# Patient Record
Sex: Male | Born: 1955 | Race: Black or African American | Hispanic: No | Smoking: Former smoker
Health system: Southern US, Community
[De-identification: ages and names within clinical notes are randomized; demographics above are authoritative.]

## PROBLEM LIST (undated history)

## (undated) DIAGNOSIS — C801 Malignant (primary) neoplasm, unspecified: Secondary | ICD-10-CM

## (undated) DIAGNOSIS — K219 Gastro-esophageal reflux disease without esophagitis: Secondary | ICD-10-CM

## (undated) DIAGNOSIS — N200 Calculus of kidney: Secondary | ICD-10-CM

## (undated) DIAGNOSIS — I10 Essential (primary) hypertension: Secondary | ICD-10-CM

## (undated) DIAGNOSIS — R011 Cardiac murmur, unspecified: Secondary | ICD-10-CM

## (undated) HISTORY — PX: CYSTOSCOPY: SHX5120

---

## 2016-06-04 ENCOUNTER — Other Ambulatory Visit: Payer: Self-pay | Admitting: General Surgery

## 2016-06-04 DIAGNOSIS — C187 Malignant neoplasm of sigmoid colon: Secondary | ICD-10-CM

## 2016-06-07 ENCOUNTER — Telehealth: Payer: Self-pay | Admitting: General Surgery

## 2016-06-07 ENCOUNTER — Ambulatory Visit
Admission: RE | Admit: 2016-06-07 | Discharge: 2016-06-07 | Disposition: A | Discharge status: Home / Self Care | Payer: BLUE CROSS/BLUE SHIELD | Source: Ambulatory Visit | Attending: General Surgery | Admitting: General Surgery

## 2016-06-07 ENCOUNTER — Other Ambulatory Visit: Payer: Self-pay | Admitting: General Surgery

## 2016-06-07 DIAGNOSIS — C187 Malignant neoplasm of sigmoid colon: Secondary | ICD-10-CM

## 2016-06-07 MED ORDER — IOPAMIDOL (ISOVUE-300) INJECTION 61%
100.0000 mL | Freq: Once | INTRAVENOUS | Status: AC | PRN
  Administered 2016-06-07: 100 mL via INTRAVENOUS

## 2016-06-07 NOTE — Telephone Encounter (Signed)
Left message on machine that it was me calling about CT scan and to call our ([facility]) office in AM.

## 2016-06-10 ENCOUNTER — Ambulatory Visit (HOSPITAL_BASED_OUTPATIENT_CLINIC_OR_DEPARTMENT_OTHER): Payer: BLUE CROSS/BLUE SHIELD | Admitting: Oncology

## 2016-06-10 ENCOUNTER — Telehealth: Payer: Self-pay | Admitting: Oncology

## 2016-06-10 ENCOUNTER — Encounter: Payer: Self-pay | Admitting: *Deleted

## 2016-06-10 ENCOUNTER — Other Ambulatory Visit: Payer: Self-pay | Admitting: *Deleted

## 2016-06-10 VITALS — BP 144/84 | HR 104 | Temp 99.5°F | Resp 20 | Ht 72.0 in | Wt 141.8 lb

## 2016-06-10 DIAGNOSIS — D649 Anemia, unspecified: Secondary | ICD-10-CM

## 2016-06-10 DIAGNOSIS — C787 Secondary malignant neoplasm of liver and intrahepatic bile duct: Secondary | ICD-10-CM | POA: Diagnosis not present

## 2016-06-10 DIAGNOSIS — G893 Neoplasm related pain (acute) (chronic): Secondary | ICD-10-CM | POA: Diagnosis not present

## 2016-06-10 DIAGNOSIS — C187 Malignant neoplasm of sigmoid colon: Secondary | ICD-10-CM | POA: Diagnosis not present

## 2016-06-10 MED ORDER — AMOXICILLIN-POT CLAVULANATE 500-125 MG PO TABS: 500.0000 mg | ORAL_TABLET | Freq: Two times a day (BID) | ORAL | 0 refills | Status: DC

## 2016-06-10 MED ORDER — OXYCODONE-ACETAMINOPHEN 5-325 MG PO TABS: 1.0000 | ORAL_TABLET | ORAL | 0 refills | Status: DC | PRN

## 2016-06-10 NOTE — Patient Instructions (Signed)
Care Plan Summary- 06/10/2016 Name:  Andre Fernandez      DOB: 1956-10-05 Your Medical Team:  Medical Oncologist:  Dr. Ma Rings Radiation Oncologist:    Surgeon:   Dr. 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   Recommendations: Based on information available as of today's consult. Recommendations may change depending on the results of further tests or exams. 1) Chemotherapy every 2 weeks with or without surgery---TBD 2) Gastrographin enema in radiology to determine if there is a leak in colon 3) Keep bowels moving and keep them soft with Miralax and Senna-S (OTC) ___________________________________________________________________________ Next Steps: 1) Radiology will call with date/time for enema study 2) See [name] as scheduled on 06/15/16 at 2 pm 3) Call for shaking chills or high fevers that do not go away Questions? [name], RN, BSN at [phone]. [name] is your Oncology Nurse Navigator and is available to assist you while you're receiving your medical care at [facility]. If found to have a leak in colon, [name] will recommend surgery to remove the cancer and may/may not require colostomy-then chemo will follow after healing If found to not have a leak, can have PAC placed on 06/17/16 and start chemo the week of 06/21/16 and you will return to see [name] on 06/18/16 at 10:00. [name] will still discuss surgical options based on potential for perforation in future with chemotherapy Stage IV cancer is not curable, but is treatable with median life expectancy of 3 years (some live less and some more) Without any treatment, the cancer could end his life in a few months We suggest you contact your employer about if they have long term disability  benefits-you will not be able to work for several months Pain: Percocet 5/325 tablets-take one every 4 hours as needed for pain (call if not controlled) Bowels: Start Miralax 17 grams in 8 ounces fluid daily as well as adding Senna-S one tablet twice daily (can increase to #2 twice daily if needed or back down if stools too loose). Best to keep your bowels soft and moving. We will send your colonoscopy tissue to [facility] for molecular testing to see if your tumor will respond to other therapies in the future, such as immunotherapy

## 2016-06-10 NOTE — Telephone Encounter (Signed)
Gave pt cal & avs, apt time per pof

## 2016-06-10 NOTE — Progress Notes (Signed)
Oncology Nurse Navigator Documentation  Oncology Nurse Navigator Flowsheets 06/10/2016  Navigator Location [facility]-Med Onc  Navigator Encounter Type Initial MedOnc  Abnormal Finding Date 06/03/2016  Confirmed Diagnosis Date 06/03/2016  Patient Visit Type MedOnc;Initial  Treatment Phase Pre-Tx/Tx Discussion;Abnormal Scans  Barriers/Navigation Needs Financial;Family concerns;Coordination of Film/video editor Finding Providers;Understanding Cancer/ Treatment Options;Coping with Diagnosis/ Prognosis;Pain/ Symptom Management;Newly Diagnosed Cancer Education;Preparing for Upcoming Surgery/ Treatment;Concerns with Insurance Coverage  Interventions Referrals;Coordination of Care;Education Method  Coordination of Care Appts--called [facility] and made appointment for 06/16/16 at 2 pm (patient notified)  Education Method Verbal;Written;Teach-back  Support Groups/Services GI Support Group;Tome;Other--[facility] support services  Acuity Level 3  Time Spent with Patient 46  Met with patient, daughter, [name] and exwife Langley Gauss during new patient visit. Explained the role of the GI Nurse Navigator and provided New Patient Packet with information on: 1.  Colon cancer-gave info on and discussed PAC, CEA test, [facility] Testing  2. Support groups 3. Advanced Directives 4. Fall Safety Plan Answered questions, reviewed current treatment plan using TEACH back and provided emotional support. Provided copy of current treatment plan to patient, daughter and ex-wife. Reviewed information with family extensively-his ex-wife is very detail oriented and asked questions frequently, often the same question. At conclusion of visit, made patient aware that we will continue to work on getting his treatment started as soon as possible and that at any time if he wishes to obtain a 2nd opinion we will be glad to assist in this process. Encouraged him to speak with his HR  representative at work to get his disability started asap if they have this benefit, if not he needs to start filing for SSD. Had him complete the patient assistance form for [facility] as well as sign consent to send tissue off. Will fax the application to [facility]. Stressed that he needs to keep his bowels soft and moving. He will call for any high fevers or shaking chills. Encouraged him to try Boost/Ensure for extra protein and calories in addition to eating high fiber diet.  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 He and his ex wife have been divorced many years, but are still friends. He will be staying with his daughter while having treatment. Currently has insurance, but encouraged him to talk with HR about how to continue this since he is not working. Patient and especially his ex are very resistant to him having a colostomy. There seems to be some distrust of medical field coming from his ex-wife. He will return on 8/4 to be seen unless [name] feels he needs surgery before chemotherapy.  [name], RN, BSN GI Oncology [place]

## 2016-06-10 NOTE — Progress Notes (Signed)
Andre Fernandez New Patient Consult   Referring MD: Stark Klein, Md [address] [place], [place] [address]   Andre Fernandez 60 y.o.  02-12-1956    Reason for Referral: Colon cancer   HPI: He reports abdominal pain and constipation beginning in March of this year. He saw his primary physician and was diagnosed with "irritable bowel syndrome ". He was initially evaluated in [place]. When he returned to [facility] he was seen at [facility] and was referred to Dr. Michail Sermon. He was taken to an upper and lower endoscopy on 06/03/2016. The upper endoscopy was unremarkable. The colonoscopy revealed a partially obstructing mass in the rectosigmoid colon. The mass was circumferential and 10 cm in length. The mass extended from 5-15 cm from the anus. Biopsies were obtained. A large amount of stool and sigmoid colon prevented visualization and further passage of the colonoscope. The pathology [id number]) revealed invasive poorly differentiated carcinoma. The tumor cells are positive for cytokeratin 20, CD X-2, and cytokeratin 5/6 consistent with poorly differentiated colorectal adenocarcinoma.   He was referred to [name]. CTs of the abdomen and pelvis on 06/07/2016 revealed coronary artery calcifications. Multiple borderline enlarged and enlarged mediastinal and bilateral hilar nodes measured up to 16 mm. Many of these nodes demonstrated coarse calcifications. Multiple pulmonary nodules suspicious for metastatic disease. Numerous hypodense lesions in the liver consistent with metastatic disease. One lesion extends medial to the liver where it appeared to partially encase the duodenum. Irregular mass in the mid sigmoid colon with surrounding metastatic deposits in the mesocolon. Small volume of ascites in the low anatomic pelvis. Numerous mesorectal lymph nodes.   Past medical history: 1.  Hypertension 2.  Glaucoma  Past surgical history: None   Medications:  Reviewed  Allergies: No Known Allergies  Family history: A sister had multiple myeloma. He had 10 siblings and he has 2 children. No family history of cancer  Social History:  He is a Administrator. He quit smoking cigarettes and using alcohol 3 years ago. No risk factor for HIV or hepatitis.   ROS:   Positives include: 30 pound weight loss, dark stool, pain with urination, constant hiccups, fever up to 101 every other day, low abdominal pain  A complete ROS was otherwise negative.  Physical Exam:  Blood pressure (!) 144/84, pulse (!) 104, temperature 99.5 F (37.5 C), temperature source Oral, resp. rate 20, height 6' (1.829 m), weight 141 lb 12.8 oz (64.3 kg), SpO2 98 %.  HEENT: Oropharynx without visible mass, neck without mass Lungs: Clear bilaterally Cardiac: Regular rate and rhythm Abdomen: No hepatosplenomegaly, mild tenderness in the right lateral subcostal region, no mass, no apparent ascites GU: Testes without mass  Vascular: No leg edema Lymph nodes: No cervical, supraclavicular, axillary, or inguinal nodes Neurologic: Alert and oriented, the motor exam appears intact in the upper and lower extremities Skin: No rash Musculoskeletal: No spine tenderness   LAB: 06/04/2016: CEA 5.2, hematoma 10.9, MCV 79.5, platelets 427,000, white count 15.2, ANC 12.9 BUN 11, creatinine 0.81, alkaline phosphatase 202, AST 56, ALT 23, bilirubin 0.8   Imaging:  As per history of present illness-CT images from 06/07/2016 reviewed with the patient and his family   Assessment/Plan:   1. Rectal cancer-biopsy of a rectal mass on 06/03/2016 confirmed invasive poorly differential carcinoma consistent with colorectal cancer  CTs of the chest, abdomen, and pelvis on 06/07/2016 revealed mediastinal lymphadenopathy, lung nodules, liver metastases, a mass that appeared to be centered at the sigmoid  colon with surrounding lymphadenopathy and peritoneal implants  2.   Microcytic anemia  3.    Pain secondary to #1  4.   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 I reviewed the CT images and discussed the prognosis and treatment options with Andre Fernandez and his family. No therapy will be curative.  His case was presented at the GI tumor conference on 06/09/2016. I discussed the case with [name] today. We are concerned he may have a perforated tumor and pelvic infection accounting for his fever. [name] recommends proceeding with a Gastrografin enema to look for evidence of a bowel perforation.  If there is no evidence of a perforation the plan will be to proceed with systemic therapy. If there is a perforation [name] will consider an attempt at resecting the primary tumor.  [name] will place a Port-A-Cath for the administration of systemic therapy. We submitted the rectal biopsy material for [facility] testing.  Andre Fernandez will use oxycodone/APAP as needed for pain. He will begin MiraLAX and Senokot. He will begin Augmentin. He will return for an office visit and further discussion on 06/18/2016.  Approximately 50 minutes were spent with the patient today. The majority of time was used for counseling and coordination of care.  Betsy Coder, MD  06/10/2016, 5:20 PM

## 2016-06-11 ENCOUNTER — Other Ambulatory Visit: Payer: Self-pay | Admitting: General Surgery

## 2016-06-15 NOTE — Progress Notes (Addendum)
GI Location of Tumor / Histology: Metastatic  Colon Cancer  Andre Fernandez presented  months ago with symptoms of: Abdominal cramping since  March 2017  Biopsies of  (if applicable) revealed: 0000000 Upper and lower Endoscopy  bxs obtained rectosigmoid colon,partial obstructing mass,The pathology [id number]) revealed invasive poorly differentiated carcinoma. The tumor cells are positive for cytokeratin 20, CD X-2, and cytokeratin 5/6 consistent with poorly differentiated colorectal adenocarcinoma.from [name] note, need path report    Past/Anticipated interventions by surgeon, if any: [name], possible Laparoscopic diverted colostomy 06/18/16  Past/Anticipated interventions by medical oncology, Dr. Glori Bickers appt 06/18/16  weight changes, if any: 30 lb weight loss,  Bowel/Bladder complaints, if any:   Nausea / Vomiting, if any:   Pain issues, if any:  Low abdominal pain  Any blood per rectum:     SAFETY ISSUES:  Prior radiation? No  Pacemaker/ICD? NO  Is the patient on methotrexate? NO  Current Complaints/Details: CT 06/07/16 :IMPRESSION: 1. 6.8 x 8.3 x 7.5 cm infiltrative colonic mass centered in the mid sigmoid colon with direct extension into the adjacent soft tissues, widespread intraperitoneal metastatic disease in the low anatomic pelvis, small volume of malignant ascites, pelvic, mesorectal and upper abdominal lymphadenopathy, widespread metastatic disease to the liver, and numerous pulmonary metastases, as above. Notably, one of the liver lesions centered in segments 5 and 6 appears to be invading of medial to the liver capsule, partially encasing the first and second portions of the duodenum. This does not appear to be causing obstruction at this time. 2. Trace bilateral pleural effusions may be malignant. 3. Extensive mediastinal and bilateral hilar lymphadenopathy is also noted. Although this could be metastatic, many these lymph nodes are coarsely  calcified, suggesting benign disease such as old granulomatous disease or potential sarcoidosis. 4. Nonobstructive calculi within the collecting systems of the kidneys bilaterally measuring up to 3 mm in the upper pole of the left kidney. 5. Additional incidental findings, as above. These results will be called to the ordering clinician or representative by the Radiologist Assistant, and communication documented in the PACS or zVision Dashboard   Allergies:

## 2016-06-16 ENCOUNTER — Encounter (HOSPITAL_COMMUNITY): Payer: Self-pay

## 2016-06-16 ENCOUNTER — Encounter (HOSPITAL_COMMUNITY)
Admission: RE | Admit: 2016-06-16 | Discharge: 2016-06-16 | Disposition: A | Discharge status: Home / Self Care | Payer: BLUE CROSS/BLUE SHIELD | Source: Ambulatory Visit | Attending: General Surgery | Admitting: General Surgery

## 2016-06-16 ENCOUNTER — Other Ambulatory Visit: Payer: Self-pay

## 2016-06-16 ENCOUNTER — Ambulatory Visit (HOSPITAL_COMMUNITY)
Admission: RE | Admit: 2016-06-16 | Discharge: 2016-06-16 | Disposition: A | Discharge status: Home / Self Care | Payer: BLUE CROSS/BLUE SHIELD | Source: Ambulatory Visit | Attending: Anesthesiology | Admitting: Anesthesiology

## 2016-06-16 ENCOUNTER — Telehealth: Payer: Self-pay | Admitting: *Deleted

## 2016-06-16 DIAGNOSIS — I10 Essential (primary) hypertension: Secondary | ICD-10-CM | POA: Insufficient documentation

## 2016-06-16 DIAGNOSIS — F22 Delusional disorders: Secondary | ICD-10-CM

## 2016-06-16 DIAGNOSIS — Z01818 Encounter for other preprocedural examination: Secondary | ICD-10-CM | POA: Diagnosis present

## 2016-06-16 DIAGNOSIS — J90 Pleural effusion, not elsewhere classified: Secondary | ICD-10-CM | POA: Insufficient documentation

## 2016-06-16 DIAGNOSIS — Z87891 Personal history of nicotine dependence: Secondary | ICD-10-CM | POA: Insufficient documentation

## 2016-06-16 HISTORY — DX: Malignant (primary) neoplasm, unspecified: C80.1

## 2016-06-16 HISTORY — DX: Calculus of kidney: N20.0

## 2016-06-16 HISTORY — DX: Gastro-esophageal reflux disease without esophagitis: K21.9

## 2016-06-16 HISTORY — DX: Cardiac murmur, unspecified: R01.1

## 2016-06-16 LAB — TYPE AND SCREEN
ABO/RH(D): O POS
ANTIBODY SCREEN: NEGATIVE

## 2016-06-16 LAB — COMPREHENSIVE METABOLIC PANEL
ALT: 31 U/L (ref 17–63)
AST: 43 U/L — ABNORMAL HIGH (ref 15–41)
Albumin: 2.3 g/dL — ABNORMAL LOW (ref 3.5–5.0)
Alkaline Phosphatase: 386 U/L — ABNORMAL HIGH (ref 38–126)
Anion gap: 10 (ref 5–15)
BUN: 7 mg/dL (ref 6–20)
CALCIUM: 9 mg/dL (ref 8.9–10.3)
CHLORIDE: 98 mmol/L — AB (ref 101–111)
CO2: 28 mmol/L (ref 22–32)
CREATININE: 0.87 mg/dL (ref 0.61–1.24)
Glucose, Bld: 121 mg/dL — ABNORMAL HIGH (ref 65–99)
Potassium: 4.1 mmol/L (ref 3.5–5.1)
Sodium: 136 mmol/L (ref 135–145)
Total Bilirubin: 0.6 mg/dL (ref 0.3–1.2)
Total Protein: 5.9 g/dL — ABNORMAL LOW (ref 6.5–8.1)

## 2016-06-16 LAB — CBC
HCT: 33.1 % — ABNORMAL LOW (ref 39.0–52.0)
Hemoglobin: 10.9 g/dL — ABNORMAL LOW (ref 13.0–17.0)
MCH: 25.9 pg — ABNORMAL LOW (ref 26.0–34.0)
MCHC: 32.9 g/dL (ref 30.0–36.0)
MCV: 78.6 fL (ref 78.0–100.0)
PLATELETS: 470 10*3/uL — AB (ref 150–400)
RBC: 4.21 MIL/uL — AB (ref 4.22–5.81)
RDW: 16 % — ABNORMAL HIGH (ref 11.5–15.5)
WBC: 16 10*3/uL — AB (ref 4.0–10.5)

## 2016-06-16 LAB — ABO/RH: ABO/RH(D): O POS

## 2016-06-16 NOTE — Pre-Procedure Instructions (Signed)
Andre Fernandez  06/16/2016      [facility], [place] Andre Fernandez [place] [address] Phone: [phone] Fax: [phone]    Your procedure is scheduled on 06/18/2016  Report to [facility] Admitting at 5:30 A.M.  Call this number if you have problems the morning of surgery:  [phone]   Remember:  Do not eat food or drink liquids after midnight.  Take these medicines the morning of surgery with A SIP OF WATER:    Bisoprolol  &( pain medicine OK if needed) , follow instructions with bowel prep,  [name], powders, [name], [name], discharge time will be determined by your treatment team.  Patients discharged the day of surgery will not be allowed to drive home.   Name and phone number of your driver:   With family Special instructions:  Special Instructions: Andre Fernandez - Preparing for Surgery  Before surgery, [name], [name], [name], [name], ears, mouth and genitals (private parts).  Wash genitals (private parts)   with your normal soap.  6.  Wash thoroughly, paying special attention to the area where your surgery will be performed.  7.  Thoroughly rinse your body with warm water from the neck down.  8.  DO NOT shower/wash with your normal soap after using and rinsing off   the CHG Soap.  9.  Pat yourself dry with a clean towel.  10.  Wear clean pajamas.            11.  Place clean sheets on your bed the night of your first shower and do not sleep with pets.  Day of Surgery  Do not apply any lotions/deodorants the morning of surgery.  Please wear clean clothes to the hospital/surgery center.  Please read over the following fact sheets that you were given. Pain Booklet and Coughing and Deep Breathing

## 2016-06-16 NOTE — Progress Notes (Signed)
Conversation with AUlice Brilliant PA-c again to review EKG result. She will meet with pt.

## 2016-06-16 NOTE — Progress Notes (Signed)
Call to A. Zelenak,PA-C, reported to her that Mr. Hesley was found to have a rapid heartrate in PAT VS check. Pt. Denies shortness breathing, any changes in his chest, reports that when he was seen in MD office yesterday it was also elevated to 120. Pt. Pulse on VS monitor is ranging from 132-142bpm. Pt. Denies afib or dysrythmia history. Will complete EKG as requested by PA.

## 2016-06-16 NOTE — Progress Notes (Signed)
Call to [facility] [place], reported that [name] has not released her OR consent order & further (pt.) needs clarification on bowel prep (or not) to include premed at home, relative to Flagyl & Neomycin.  Followup call to Abigail Butts to mention that Anesth. PA was reviewing pt. Health history & how he is feeling due to tachycardia .

## 2016-06-16 NOTE — Progress Notes (Signed)
Anesthesia Note: Patient is a 60 year old male scheduled for laparoscopic diverted colostomy, insertion of a Port-a-cath on 06/18/16 by Dr. Barry Fernandez.  History includes stage IV colon cancer with partially obstructing sigmoid mass (diagnosed 06/03/16), HTN, glaucoma, microcytic anemia, former smoker (quit 11/15/13). Quit ETOH ~ 3 years ago. In the months prior to his diagnosis of colon cancer, his doctor in [facility] was treating him for what was thought to be GERD and IBS.   He lives in [place], [place]. His daughter used to work at [employer] as a transporter. Patient currently living with daughter Andre Fernandez and her brother who has autism. Currently no PCP in [place], but was seeing Dr. Rudi Fernandez with [facility] in [place], [place]. He has been seen once at [facility] (walk-in clinic) in [facility] in 02/2016 for reflux, IBS, and weight loss (25-30 lb since 12/2015). He was referred to GI Dr. Michail Fernandez. Patient ultimately had a colonoscopy with biopsies revealing invasive poorly differentiated carcinoma. He was then referred to [facility] Dr. Benay Fernandez and to [facility] Dr. Barry Fernandez. Surgery recommended as above, sooner if he develops bloating and vomiting. Reportedly he was just seen by Dr. Barry Fernandez yesterday to discuss surgery plans.   Meds include Augmentin (started by Dr. Benay Fernandez on 06/10/16; having abdominal pains with intermittent fevers up to 101), bisoprolol-HCTZ 10-6.25 mg (Ziac), Percocet, Senna, Lumigan. Had Augmentin, Percocet, Tylenol (8:30 AM of T ~ 100 F). Last took [place] yesterday evening. He is to start Flagyl and neomycin the day before surgery.   PAT Vitals: BP 118/76, HR 132, RR 18, T 36.8C, O2 sat 99%. HT 6'. WT 141 lb (64.2 kg). BMI 19.20.  Exam shows a pleasant black male in NAD. He is tall and thin. Sitting in a wheelchair mostly, but can walk to bathroom unassisted. Heart regular rhythm, but tachycardic in the 120's. No murmur heard. Lung sounds diminished, particularly in the bases. No  carotid bruits noted. No pretibial edema noted. Good mouth opening. BS are hypoactive. Abdomen soft. Non-tender to soft palpation.  He denied CP, SOB, syncope, presyncope, nausea. Denied dizziness with standing. Pain in the abdomen when walking (started in March). This is not worse. Last BM 06/16/16. He has occasional frothy emesis when he develops hiccups, last episode 06/12/16. He does feel his voice quality is weaker since March 2017. He denied personal history of CAD/MI/CHF/PCI. He does have a family history of CAD in his parents when they were in their 68's and 81's. He sleeps on his left side for comfort.   06/16/16 EKG: ST at 127 bpm. (HR was ~ 120-128 bpm during the PAT RN interview.)  06/07/16 CT chest/abd/pelvis: IMPRESSION: 1. 6.8 x 8.3 x 7.5 cm infiltrative colonic mass centered in the mid sigmoid colon with direct extension into the adjacent soft tissues, widespread intraperitoneal metastatic disease in the low anatomic pelvis, small volume of malignant ascites, pelvic, mesorectal and upper abdominal lymphadenopathy, widespread metastatic disease to the liver, and numerous pulmonary metastases, as above. Notably, one of the liver lesions centered in segments 5 and 6 appears to be invading of medial to the liver capsule, partially encasing the first and second portions of the duodenum. This does not appear to be causing obstruction at this time. 2. Trace bilateral pleural effusions may be malignant. 3. Cardiovascular: Heart size is normal. Small amount of anterior pericardial fluid and/or thickening, unlikely to be of any hemodynamic significance at this time. No associated pericardial calcification. There is aortic atherosclerosis, as well as atherosclerosis  of the great vessels of the mediastinum and the coronary arteries, including calcified atherosclerotic plaque in the left anterior descending coronary arteries. 4. Extensive mediastinal and bilateral hilar lymphadenopathy is  also noted. Although this could be metastatic, many these lymph nodes are coarsely calcified, suggesting benign disease such as old granulomatous disease or potential sarcoidosis. 5. Nonobstructive calculi within the collecting systems of the kidneys bilaterally measuring up to 3 mm in the upper pole of the left kidney.  06/16/16 CXR: IMPRESSION: 1. Small bilateral pleural effusions have increased since the prior study. 2. The lungs are otherwise clear.  Preoperative labs noted. WBC 16.0 (previously 15.2K on 06/04/16), H/H 10.9/33.1 (previously 10.9/34.1 on 06/04/16). Cr 0.87. Alk Phos 386. Albumin 2.3. AST 43, ALT 31, total protein 5.9.   Patient has baseline tachycardia, primarily in the 120's today at PAT. He does not appear in any acute distress. He was tachycardic at 104 bpm at his 06/10/16 visit and reportedly 119 bpm at his 06/15/16 with Dr. Barry Fernandez. He is not having chest pain, dizziness, or SOB. EKG does not show any concerning ST/T wave changes. I think tachycardia is multifactorial including deconditioning, malnutrition, leukocytosis with intermittent low grade fever, pain. He does not feel any different from when he saw Dr. Barry Fernandez yesterday, and reportedly had discussed with her the intermittent fevers, abdominal pain (present since 01/2016), and rare emesis. He is now on pain meds and antibiotic therapy. He said he was instructed to go to the ED if his temperature was > 101 despite Tylenol or if he develops bloating or persistent N/V. We reviewed how he could monitor his radial pulse at home. I also advised that if his HR was > 120's range to seek further evaluation in the ED, or if in general not feeling well, eating poorly, or develops SOB, CP. He will continue Ziac which includes a b-blocker which will hopefully help with his HR control. Discussed with anesthesiologist Dr. Orene Fernandez as well. Agree with above plan. Patient will be further evaluated by his surgeon and anesthesiologist on the day of  surgery. If patient is still tachycardiac then it can be addressed on the day of surgery with IVF, medications as needed. I have sent a staff message to Dr. Barry Fernandez as well giving an update so she can contact him for any additional recommendations.  George Hugh [facility] Short Stay Center/Anesthesiology Phone [phone] 06/16/2016 6:09 PM

## 2016-06-16 NOTE — H&P (Signed)
Andre Fernandez 06/15/2016 9:45 AM Location: [facility] Patient #: [id number] DOB: September 24, 1956 Single / Language: Cleophus Molt / Race: Black or African American Male   History of Present Illness Stark Klein MD; 06/15/2016 5:35 PM) Patient words: Discuss before surgery.  The patient is a 60 year old male who presents with colorectal cancer. Patient is a 60 year old male referred for consultation by Dr. Michail Sermon for a new diagnosis of sigmoid colon cancer in late July 2017. The patient was self-referred to Dr. Michail Sermon with new diarrhea and weight loss. He also is having reflux. He states that he has a good appetite, but this is not able to eat as much as he previously had been. 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 He no longer drinks or smokes and hasn't for a year. He has no family history of cancer. He has not ever had a colonoscopy prior to this one.  Dr. Michail Sermon performed a colonoscopy on him on 06/03/2016. He was seen to have a malignant partially obstructing tumor in the rectosigmoid colon. This was biopsied. Biopsy is not yet back but this was obviously fungating and was around two thirds of the circumference of the colon. I ordered scans on him and referral to oncology. He was found to have a much larger tumor than expected, multple liver mets, and probable contained perforation of mass . We reviewed him at conference and recommendation was to divert him and then do chemo in order to minimize risk of freely perforated tumor with peritonitis while getting chemo. He is here to discuss.   When he saw [name], he was having fevers of 103 at home. He placed him on Augmentin. We discussed doing a gastrograffin enema, but it was felt to increase risk of perforation, rather than give [place] information. He is still having fevers, but they are only to 100-101.   He is having some lower abdominal pain and taking 1 percocet in the morning  and at bedtime. He is taking 1-2 motrin during the day. He reports that his weight is stable. He is taking stool softeners and eating fiber. He is stooling soft stools.   CT abd/pelvis 06/07/16 IMPRESSION: 1. 6.8 x 8.3 x 7.5 cm infiltrative colonic mass centered in the mid sigmoid colon with direct extension into the adjacent soft tissues, widespread intraperitoneal metastatic disease in the low anatomic pelvis, small volume of malignant ascites, pelvic, mesorectal and upper abdominal lymphadenopathy, widespread metastatic disease to the liver, and numerous pulmonary metastases, as above. Notably, one of the liver lesions centered in segments 5 and 6 appears to be invading of medial to the liver capsule, partially encasing the first and second portions of the duodenum. This does not appear to be causing obstruction at this time. 2. Trace bilateral pleural effusions may be malignant. 3. Extensive mediastinal and bilateral hilar lymphadenopathy is also noted. Although this could be metastatic, many these lymph nodes are coarsely calcified, suggesting benign disease such as old granulomatous disease or potential sarcoidosis. 4. Nonobstructive calculi within the collecting systems of the kidneys bilaterally measuring up to 3 mm in the upper pole of the left kidney. 5. Additional incidental findings, as above. These results will be called to the ordering clinician or representative by the Radiologist Assistant, and communication documented in the PACS or zVision Dashboard.    Allergies Patsey Berthold, [place]; 06/15/2016 9:45 AM) No Known Drug Allergies07/21/2017  Medication History Patsey Berthold, CMA; 06/15/2016 9:46 AM)  Neomycin Sulfate (500MG  Tablet, 2 (two) Tablet Oral SEE NOTE, Taken starting 06/04/2016) Active. (TAKE TWO TABLETS AT 2 PM, 3 PM, AND 10 PM THE DAY PRIOR TO SURGERY) Flagyl (500MG  Tablet, 2 (two) Tablet Oral SEE NOTE, Taken starting 06/04/2016) Active. (Take at 2pm,  3pm, and 10pm the day prior to your colon operation) Bisoprolol-Hydrochlorothiazide (10-6.25MG  Tablet, Oral) Active. Senna S (8.6-50MG  Tablet, Oral) Active. MiraLax (Oral) Active. Percocet (5-325MG  Tablet, Oral) Active. Amoxicillin-Pot Clavulanate (500-125MG  Tablet, Oral) Active. Medications Reconciled    Review of Systems Stark Klein MD; 06/15/2016 5:34 PM) All other systems negative  Vitals Patsey Berthold CMA; 06/15/2016 9:47 AM) 06/15/2016 9:46 AM Weight: 144.6 lb Height: 72in Body Surface Area: 1.86 m Body Mass Index: 19.61 kg/m  Temp.: 98.7F  Pulse: 119 (Regular)  BP: 120/72 (Sitting, Left Arm, Standard)       Physical Exam Stark Klein MD; 06/15/2016 5:35 PM) General Mental Status-Alert. General Appearance-Consistent with stated age. Hydration-Well hydrated. Voice-Normal.  Eye Note: anicteric   Chest and Lung Exam Chest and lung exam reveals -quiet, even and easy respiratory effort with no use of accessory muscles. Inspection Chest Wall - Normal. Back - normal.  Abdomen Inspection-Inspection Normal. Palpation/Percussion Palpation and Percussion of the abdomen reveal - Soft, Non Tender, No Rebound tenderness, No Rigidity (guarding) and No hepatosplenomegaly.    Assessment & Plan Stark Klein MD; 06/15/2016 5:42 PM) CANCER OF SIGMOID COLON (C18.7) Impression: Due to the markedly different findings on the scan than anticipated, we will plan lap diverting ostomy and port a cath.  We reviewed that he looks partially obstructed and appears to have had a contained perforation. This is why we would plan diverting ostomy, in order to minimize his risk of frank peritonitis and emergency surgery when he is on chemo and more immunocompromised.  If he appears to have any sort of leak near the tumor, I would leave a drain.  I would go ahead and place port in order to get him to chemo as soon as possible.  I discussed that he would have a  risk of having too extensive carcinomatosis to do an ostomy and just get a port. I also reviewed that the ostomy may end up being permanent depending on what happens to the rest of his disease.  I also discussed risk of pneumothorax, risk of clot/malfunction with port, risk of hernia etc. Pt wishes to proceed ASAP. I have him on the schedule for this friday.  He will continue augmentin. 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  Would plan treatment with chemotherapy. 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 CONSTIPATION BY OUTLET OBSTRUCTION (K59.02) Impression: Continue stool softeners.    Signed by Stark Klein, MD (06/15/2016 5:43 PM)

## 2016-06-16 NOTE — Telephone Encounter (Signed)
Oncology Nurse Navigator Documentation  Oncology Nurse Navigator Flowsheets 06/16/2016  Navigator Location [facility]  Navigator Encounter Type Telephone  Telephone Outgoing Call;Appt Confirmation/Clarification--called daughter, Andre Fernandez (his phone was not working) and provided new appointment to see [name] on 8/16 at 11:00. She was informed this is not to start his chemo, just to discuss the pathology and the treatment planned and drugs he will be given. He will then need to attend a chemo class. [name] got on the phone wanting chemo to start that day-informed her he has to heal before he can safely start chemo.  Abnormal Finding Date -  Confirmed Diagnosis Date -  Patient Visit Type -  Treatment Phase -  Barriers/Navigation Needs -  Education -  Interventions -  Coordination of Care -  Education Method -  Support Groups/Services -  Acuity -  Time Spent with Patient 15

## 2016-06-17 ENCOUNTER — Ambulatory Visit
Admission: RE | Admit: 2016-06-17 | Discharge: 2016-06-17 | Disposition: A | Discharge status: Home / Self Care | Payer: BLUE CROSS/BLUE SHIELD | Source: Ambulatory Visit | Attending: Radiation Oncology | Admitting: Radiation Oncology

## 2016-06-17 ENCOUNTER — Ambulatory Visit: Payer: BLUE CROSS/BLUE SHIELD | Admitting: Radiation Oncology

## 2016-06-17 NOTE — Anesthesia Preprocedure Evaluation (Addendum)
Anesthesia Evaluation  Patient identified by MRN, date of birth, ID band Patient awake    Reviewed: Allergy & Precautions, H&P , NPO status , Patient's Chart, lab work & pertinent test results, reviewed documented beta blocker date and time   Airway Mallampati: II  TM Distance: >3 FB Neck ROM: Full    Dental no notable dental hx. (+) Teeth Intact, Dental Advisory Given   Pulmonary neg pulmonary ROS, former smoker,    Pulmonary exam normal breath sounds clear to auscultation       Cardiovascular + Valvular Problems/Murmurs  Rhythm:Regular Rate:Normal     Neuro/Psych negative neurological ROS  negative psych ROS   GI/Hepatic Neg liver ROS, GERD  Controlled,Colon CA w/ liver mets   Endo/Other  negative endocrine ROS  Renal/GU negative Renal ROS  negative genitourinary   Musculoskeletal   Abdominal   Peds  Hematology negative hematology ROS (+)   Anesthesia Other Findings   Reproductive/Obstetrics negative OB ROS                            Anesthesia Physical Anesthesia Plan  ASA: III  Anesthesia Plan: General   Post-op Pain Management:    Induction: Intravenous  Airway Management Planned: Oral ETT  Additional Equipment:   Intra-op Plan:   Post-operative Plan: Extubation in OR  Informed Consent: I have reviewed the patients History and Physical, chart, labs and discussed the procedure including the risks, benefits and alternatives for the proposed anesthesia with the patient or authorized representative who has indicated his/her understanding and acceptance.   Dental advisory given  Plan Discussed with: CRNA, Anesthesiologist and Surgeon  Anesthesia Plan Comments:        Anesthesia Quick Evaluation

## 2016-06-18 ENCOUNTER — Inpatient Hospital Stay (HOSPITAL_COMMUNITY): Payer: BLUE CROSS/BLUE SHIELD

## 2016-06-18 ENCOUNTER — Inpatient Hospital Stay (HOSPITAL_COMMUNITY): Payer: BLUE CROSS/BLUE SHIELD | Admitting: Vascular Surgery

## 2016-06-18 ENCOUNTER — Ambulatory Visit: Payer: BLUE CROSS/BLUE SHIELD | Admitting: Physical Therapy

## 2016-06-18 ENCOUNTER — Encounter (HOSPITAL_COMMUNITY)
Admission: RE | Disposition: A | Discharge status: Home Health | Payer: Self-pay | Source: Ambulatory Visit | Attending: General Surgery

## 2016-06-18 ENCOUNTER — Ambulatory Visit: Payer: BLUE CROSS/BLUE SHIELD | Admitting: Oncology

## 2016-06-18 ENCOUNTER — Encounter (HOSPITAL_COMMUNITY): Payer: Self-pay | Admitting: General Surgery

## 2016-06-18 ENCOUNTER — Inpatient Hospital Stay (HOSPITAL_COMMUNITY)
Admission: RE | Admit: 2016-06-18 | Discharge: 2016-06-26 | DRG: 330 | Disposition: A | Discharge status: Home Health | Payer: BLUE CROSS/BLUE SHIELD | Source: Ambulatory Visit | Attending: General Surgery | Admitting: General Surgery

## 2016-06-18 ENCOUNTER — Inpatient Hospital Stay (HOSPITAL_COMMUNITY): Payer: BLUE CROSS/BLUE SHIELD | Admitting: Anesthesiology

## 2016-06-18 DIAGNOSIS — Z79899 Other long term (current) drug therapy: Secondary | ICD-10-CM | POA: Diagnosis not present

## 2016-06-18 DIAGNOSIS — C786 Secondary malignant neoplasm of retroperitoneum and peritoneum: Secondary | ICD-10-CM | POA: Diagnosis present

## 2016-06-18 DIAGNOSIS — Z87891 Personal history of nicotine dependence: Secondary | ICD-10-CM | POA: Diagnosis not present

## 2016-06-18 DIAGNOSIS — C7800 Secondary malignant neoplasm of unspecified lung: Secondary | ICD-10-CM | POA: Diagnosis present

## 2016-06-18 DIAGNOSIS — C787 Secondary malignant neoplasm of liver and intrahepatic bile duct: Secondary | ICD-10-CM | POA: Diagnosis present

## 2016-06-18 DIAGNOSIS — K913 Postprocedural intestinal obstruction: Secondary | ICD-10-CM | POA: Diagnosis not present

## 2016-06-18 DIAGNOSIS — R590 Localized enlarged lymph nodes: Secondary | ICD-10-CM | POA: Diagnosis present

## 2016-06-18 DIAGNOSIS — R180 Malignant ascites: Secondary | ICD-10-CM | POA: Diagnosis present

## 2016-06-18 DIAGNOSIS — K219 Gastro-esophageal reflux disease without esophagitis: Secondary | ICD-10-CM | POA: Diagnosis present

## 2016-06-18 DIAGNOSIS — Y838 Other surgical procedures as the cause of abnormal reaction of the patient, or of later complication, without mention of misadventure at the time of the procedure: Secondary | ICD-10-CM | POA: Diagnosis not present

## 2016-06-18 DIAGNOSIS — C189 Malignant neoplasm of colon, unspecified: Secondary | ICD-10-CM

## 2016-06-18 DIAGNOSIS — Z419 Encounter for procedure for purposes other than remedying health state, unspecified: Secondary | ICD-10-CM

## 2016-06-18 DIAGNOSIS — C187 Malignant neoplasm of sigmoid colon: Secondary | ICD-10-CM | POA: Diagnosis present

## 2016-06-18 DIAGNOSIS — Z95828 Presence of other vascular implants and grafts: Secondary | ICD-10-CM

## 2016-06-18 HISTORY — PX: LAPAROSCOPIC DIVERTED COLOSTOMY: SHX5892

## 2016-06-18 HISTORY — PX: PORTACATH PLACEMENT: SHX2246

## 2016-06-18 HISTORY — PX: OTHER SURGICAL HISTORY: SHX169

## 2016-06-18 HISTORY — DX: Essential (primary) hypertension: I10

## 2016-06-18 LAB — CBC
HCT: 34 % — ABNORMAL LOW (ref 39.0–52.0)
HEMOGLOBIN: 11 g/dL — AB (ref 13.0–17.0)
MCH: 25.3 pg — ABNORMAL LOW (ref 26.0–34.0)
MCHC: 32.4 g/dL (ref 30.0–36.0)
MCV: 78.2 fL (ref 78.0–100.0)
Platelets: 461 10*3/uL — ABNORMAL HIGH (ref 150–400)
RBC: 4.35 MIL/uL (ref 4.22–5.81)
RDW: 15.8 % — AB (ref 11.5–15.5)
WBC: 19.7 10*3/uL — ABNORMAL HIGH (ref 4.0–10.5)

## 2016-06-18 LAB — CREATININE, SERUM
CREATININE: 0.9 mg/dL (ref 0.61–1.24)
GFR calc Af Amer: >60 mL/min (ref >60)

## 2016-06-18 SURGERY — CREATION, COLOSTOMY, DIVERTING, LAPAROSCOPIC
Anesthesia: General | Site: Chest

## 2016-06-18 MED ORDER — ROCURONIUM BROMIDE 100 MG/10ML IV SOLN
INTRAVENOUS | Status: DC | PRN
  Administered 2016-06-18: 10 mg via INTRAVENOUS
  Administered 2016-06-18: 30 mg via INTRAVENOUS
  Administered 2016-06-18: 50 mg via INTRAVENOUS

## 2016-06-18 MED ORDER — SUGAMMADEX SODIUM 200 MG/2ML IV SOLN
INTRAVENOUS | Status: DC | PRN
  Administered 2016-06-18: 200 mg via INTRAVENOUS

## 2016-06-18 MED ORDER — BISOPROLOL-HYDROCHLOROTHIAZIDE 10-6.25 MG PO TABS
1.0000 | ORAL_TABLET | ORAL | Status: DC
  Administered 2016-06-19 – 2016-06-26 (×8): 1 via ORAL
  Filled 2016-06-18 (×8): qty 1

## 2016-06-18 MED ORDER — SODIUM CHLORIDE 0.9 % IV SOLN
INTRAVENOUS | Status: DC | PRN
  Administered 2016-06-18: 07:00:00

## 2016-06-18 MED ORDER — ZOLPIDEM TARTRATE 5 MG PO TABS
5.0000 mg | ORAL_TABLET | Freq: Every evening | ORAL | Status: DC | PRN
  Administered 2016-06-18 – 2016-06-19 (×2): 5 mg via ORAL
  Filled 2016-06-18 (×2): qty 1

## 2016-06-18 MED ORDER — AMOXICILLIN-POT CLAVULANATE 500-125 MG PO TABS
500.0000 mg | ORAL_TABLET | Freq: Two times a day (BID) | ORAL | Status: DC
  Administered 2016-06-18 – 2016-06-26 (×16): 500 mg via ORAL
  Filled 2016-06-18 (×18): qty 1

## 2016-06-18 MED ORDER — EPHEDRINE 5 MG/ML INJ
INTRAVENOUS | Status: AC
  Filled 2016-06-18: qty 10

## 2016-06-18 MED ORDER — LIDOCAINE HCL (CARDIAC) 20 MG/ML IV SOLN
INTRAVENOUS | Status: DC | PRN
  Administered 2016-06-18: 60 mg via INTRAVENOUS

## 2016-06-18 MED ORDER — HYDROMORPHONE HCL 1 MG/ML IJ SOLN
0.5000 mg | INTRAMUSCULAR | Status: DC | PRN
  Administered 2016-06-18 – 2016-06-19 (×6): 2 mg via INTRAVENOUS
  Administered 2016-06-19 – 2016-06-20 (×3): 1 mg via INTRAVENOUS
  Administered 2016-06-20 – 2016-06-22 (×3): 2 mg via INTRAVENOUS
  Administered 2016-06-22: 1 mg via INTRAVENOUS
  Administered 2016-06-23 – 2016-06-24 (×7): 2 mg via INTRAVENOUS
  Filled 2016-06-18 (×6): qty 2
  Filled 2016-06-18: qty 1
  Filled 2016-06-18 (×5): qty 2
  Filled 2016-06-18: qty 1
  Filled 2016-06-18: qty 2
  Filled 2016-06-18 (×2): qty 1
  Filled 2016-06-18 (×4): qty 2

## 2016-06-18 MED ORDER — ACETAMINOPHEN 325 MG PO TABS: 325.0000 mg | ORAL_TABLET | Freq: Four times a day (QID) | ORAL | Status: DC | PRN

## 2016-06-18 MED ORDER — HEPARIN SOD (PORK) LOCK FLUSH 100 UNIT/ML IV SOLN
INTRAVENOUS | Status: DC | PRN
  Administered 2016-06-18: 500 [IU] via INTRAVENOUS

## 2016-06-18 MED ORDER — LIDOCAINE HCL (PF) 1 % IJ SOLN
INTRAMUSCULAR | Status: AC
  Filled 2016-06-18: qty 30

## 2016-06-18 MED ORDER — ENOXAPARIN SODIUM 40 MG/0.4ML ~~LOC~~ SOLN
40.0000 mg | SUBCUTANEOUS | Status: DC
  Administered 2016-06-19 – 2016-06-26 (×8): 40 mg via SUBCUTANEOUS
  Filled 2016-06-18 (×8): qty 0.4

## 2016-06-18 MED ORDER — POLYETHYLENE GLYCOL 3350 17 GM/SCOOP PO POWD
17.0000 g | Freq: Every day | ORAL | Status: DC
  Administered 2016-06-18 – 2016-06-25 (×7): 17 g via ORAL
  Filled 2016-06-18: qty 255

## 2016-06-18 MED ORDER — SENNOSIDES-DOCUSATE SODIUM 8.6-50 MG PO TABS
1.0000 | ORAL_TABLET | Freq: Two times a day (BID) | ORAL | Status: DC
  Administered 2016-06-18 – 2016-06-25 (×14): 1 via ORAL
  Filled 2016-06-18 (×15): qty 1

## 2016-06-18 MED ORDER — METHOCARBAMOL 500 MG PO TABS
500.0000 mg | ORAL_TABLET | Freq: Four times a day (QID) | ORAL | Status: DC | PRN
  Administered 2016-06-20 – 2016-06-26 (×5): 500 mg via ORAL
  Filled 2016-06-18 (×6): qty 1

## 2016-06-18 MED ORDER — PHENYLEPHRINE HCL 10 MG/ML IJ SOLN
INTRAVENOUS | Status: DC | PRN
  Administered 2016-06-18: 40 ug/min via INTRAVENOUS

## 2016-06-18 MED ORDER — PROPOFOL 10 MG/ML IV BOLUS
INTRAVENOUS | Status: AC
  Filled 2016-06-18: qty 20

## 2016-06-18 MED ORDER — KCL IN DEXTROSE-NACL 20-5-0.45 MEQ/L-%-% IV SOLN
INTRAVENOUS | Status: DC
  Administered 2016-06-18 – 2016-06-24 (×10): via INTRAVENOUS
  Filled 2016-06-18 (×14): qty 1000

## 2016-06-18 MED ORDER — PROPOFOL 10 MG/ML IV BOLUS
INTRAVENOUS | Status: DC | PRN
  Administered 2016-06-18: 150 mg via INTRAVENOUS

## 2016-06-18 MED ORDER — HEPARIN SOD (PORK) LOCK FLUSH 100 UNIT/ML IV SOLN
INTRAVENOUS | Status: AC
Start: 2016-06-18 — End: 2016-06-18
  Filled 2016-06-18: qty 5

## 2016-06-18 MED ORDER — FENTANYL CITRATE (PF) 250 MCG/5ML IJ SOLN
INTRAMUSCULAR | Status: AC
  Filled 2016-06-18: qty 5

## 2016-06-18 MED ORDER — SUCCINYLCHOLINE CHLORIDE 200 MG/10ML IV SOSY
PREFILLED_SYRINGE | INTRAVENOUS | Status: AC
  Filled 2016-06-18: qty 10

## 2016-06-18 MED ORDER — ROCURONIUM BROMIDE 50 MG/5ML IV SOLN
INTRAVENOUS | Status: AC
  Filled 2016-06-18: qty 1

## 2016-06-18 MED ORDER — DIPHENHYDRAMINE HCL 12.5 MG/5ML PO ELIX: 12.5000 mg | ORAL_SOLUTION | Freq: Four times a day (QID) | ORAL | Status: DC | PRN

## 2016-06-18 MED ORDER — ONDANSETRON 4 MG PO TBDP: 4.0000 mg | ORAL_TABLET | Freq: Four times a day (QID) | ORAL | Status: DC | PRN

## 2016-06-18 MED ORDER — BUPIVACAINE-EPINEPHRINE (PF) 0.25% -1:200000 IJ SOLN
INTRAMUSCULAR | Status: AC
  Filled 2016-06-18: qty 30

## 2016-06-18 MED ORDER — DIPHENHYDRAMINE HCL 50 MG/ML IJ SOLN
12.5000 mg | Freq: Four times a day (QID) | INTRAMUSCULAR | Status: DC | PRN
Start: 2016-06-18 — End: 2016-06-24
  Filled 2016-06-18: qty 1

## 2016-06-18 MED ORDER — ONDANSETRON HCL 4 MG/2ML IJ SOLN
INTRAMUSCULAR | Status: AC
  Filled 2016-06-18: qty 2

## 2016-06-18 MED ORDER — DEXTROSE 5 % IV SOLN
INTRAVENOUS | Status: AC
  Filled 2016-06-18: qty 1

## 2016-06-18 MED ORDER — ONDANSETRON HCL 4 MG/2ML IJ SOLN
INTRAMUSCULAR | Status: DC | PRN
  Administered 2016-06-18: 4 mg via INTRAVENOUS

## 2016-06-18 MED ORDER — MIDAZOLAM HCL 2 MG/2ML IJ SOLN
INTRAMUSCULAR | Status: AC
  Filled 2016-06-18: qty 2

## 2016-06-18 MED ORDER — LACTATED RINGERS IV SOLN
INTRAVENOUS | Status: DC | PRN
  Administered 2016-06-18 (×3): via INTRAVENOUS

## 2016-06-18 MED ORDER — CHLORHEXIDINE GLUCONATE CLOTH 2 % EX PADS: 6.0000 | MEDICATED_PAD | Freq: Once | CUTANEOUS | Status: DC

## 2016-06-18 MED ORDER — CEFAZOLIN SODIUM-DEXTROSE 2-4 GM/100ML-% IV SOLN
INTRAVENOUS | Status: AC
  Filled 2016-06-18: qty 100

## 2016-06-18 MED ORDER — MIDAZOLAM HCL 5 MG/5ML IJ SOLN
INTRAMUSCULAR | Status: DC | PRN
  Administered 2016-06-18: 2 mg via INTRAVENOUS

## 2016-06-18 MED ORDER — OXYCODONE-ACETAMINOPHEN 5-325 MG PO TABS
1.0000 | ORAL_TABLET | ORAL | Status: DC | PRN
  Administered 2016-06-20 – 2016-06-26 (×13): 2 via ORAL
  Filled 2016-06-18 (×13): qty 2

## 2016-06-18 MED ORDER — ONDANSETRON HCL 4 MG/2ML IJ SOLN
4.0000 mg | Freq: Four times a day (QID) | INTRAMUSCULAR | Status: DC | PRN
  Administered 2016-06-20 (×2): 4 mg via INTRAVENOUS
  Filled 2016-06-18 (×2): qty 2

## 2016-06-18 MED ORDER — FENTANYL CITRATE (PF) 100 MCG/2ML IJ SOLN
INTRAMUSCULAR | Status: DC | PRN
  Administered 2016-06-18 (×2): 100 ug via INTRAVENOUS
  Administered 2016-06-18: 50 ug via INTRAVENOUS

## 2016-06-18 MED ORDER — LIDOCAINE HCL 1 % IJ SOLN
INTRAMUSCULAR | Status: DC | PRN
  Administered 2016-06-18: 07:00:00

## 2016-06-18 MED ORDER — HYDROMORPHONE HCL 1 MG/ML IJ SOLN
INTRAMUSCULAR | Status: AC
  Filled 2016-06-18: qty 1

## 2016-06-18 MED ORDER — 0.9 % SODIUM CHLORIDE (POUR BTL) OPTIME
TOPICAL | Status: DC | PRN
  Administered 2016-06-18 (×2): 1000 mL

## 2016-06-18 MED ORDER — LATANOPROST 0.005 % OP SOLN
1.0000 [drp] | Freq: Every day | OPHTHALMIC | Status: DC
  Administered 2016-06-18 – 2016-06-25 (×8): 1 [drp] via OPHTHALMIC
  Filled 2016-06-18: qty 2.5

## 2016-06-18 MED ORDER — CEFAZOLIN SODIUM-DEXTROSE 2-4 GM/100ML-% IV SOLN: 2.0000 g | INTRAVENOUS | Status: DC

## 2016-06-18 MED ORDER — SIMETHICONE 80 MG PO CHEW: 40.0000 mg | CHEWABLE_TABLET | Freq: Four times a day (QID) | ORAL | Status: DC | PRN

## 2016-06-18 MED ORDER — HYDROMORPHONE HCL 1 MG/ML IJ SOLN
0.2500 mg | INTRAMUSCULAR | Status: DC | PRN
  Administered 2016-06-18: 0.25 mg via INTRAVENOUS
  Administered 2016-06-18: 0.5 mg via INTRAVENOUS
  Administered 2016-06-18: 0.25 mg via INTRAVENOUS

## 2016-06-18 MED ORDER — CEFOXITIN SODIUM 1 G IV SOLR
INTRAVENOUS | Status: DC | PRN
  Administered 2016-06-18: 1 g via INTRAVENOUS

## 2016-06-18 SURGICAL SUPPLY — 75 items
BAG DECANTER FOR FLEXI CONT (MISCELLANEOUS) ×4 IMPLANT
BLADE SURG 11 STRL SS (BLADE) ×4 IMPLANT
BLADE SURG 15 STRL LF DISP TIS (BLADE) ×2 IMPLANT
BLADE SURG 15 STRL SS (BLADE) ×2
CANISTER SUCTION 2500CC (MISCELLANEOUS) ×8 IMPLANT
CATH FOLEY 2WAY SLVR 30CC 16FR (CATHETERS) ×4 IMPLANT
CATH ROBINSON RED A/P 14FR (CATHETERS) ×4 IMPLANT
CHLORAPREP W/TINT 10.5 ML (MISCELLANEOUS) ×4 IMPLANT
COVER MAYO STAND STRL (DRAPES) ×4 IMPLANT
COVER SURGICAL LIGHT HANDLE (MISCELLANEOUS) ×4 IMPLANT
COVER TRANSDUCER ULTRASND GEL (DRAPE) IMPLANT
CRADLE DONUT ADULT HEAD (MISCELLANEOUS) ×4 IMPLANT
DECANTER SPIKE VIAL GLASS SM (MISCELLANEOUS) IMPLANT
DERMABOND ADVANCED (GAUZE/BANDAGES/DRESSINGS) ×2
DERMABOND ADVANCED .7 DNX12 (GAUZE/BANDAGES/DRESSINGS) ×2 IMPLANT
DRAPE C-ARM 42X72 X-RAY (DRAPES) ×4 IMPLANT
DRAPE CHEST BREAST 15X10 FENES (DRAPES) IMPLANT
DRAPE UTILITY XL STRL (DRAPES) ×4 IMPLANT
DRAPE WARM FLUID 44X44 (DRAPE) ×4 IMPLANT
ELECT COATED BLADE 2.86 ST (ELECTRODE) ×4 IMPLANT
ELECT REM PT RETURN 9FT ADLT (ELECTROSURGICAL) ×4
ELECTRODE REM PT RTRN 9FT ADLT (ELECTROSURGICAL) ×2 IMPLANT
GAUZE SPONGE 4X4 16PLY XRAY LF (GAUZE/BANDAGES/DRESSINGS) ×4 IMPLANT
GEL ULTRASOUND 20GR AQUASONIC (MISCELLANEOUS) IMPLANT
GLOVE BIO SURGEON STRL SZ 6 (GLOVE) ×16 IMPLANT
GLOVE BIO SURGEON STRL SZ8 (GLOVE) ×4 IMPLANT
GLOVE BIOGEL PI IND STRL 6.5 (GLOVE) ×10 IMPLANT
GLOVE BIOGEL PI IND STRL 7.5 (GLOVE) ×4 IMPLANT
GLOVE BIOGEL PI IND STRL 8 (GLOVE) ×8 IMPLANT
GLOVE BIOGEL PI INDICATOR 6.5 (GLOVE) ×10
GLOVE BIOGEL PI INDICATOR 7.5 (GLOVE) ×4
GLOVE BIOGEL PI INDICATOR 8 (GLOVE) ×8
GLOVE ECLIPSE 7.5 STRL STRAW (GLOVE) ×8 IMPLANT
GLOVE SURG SS PI 6.5 STRL IVOR (GLOVE) ×4 IMPLANT
GOWN PREVENTION PLUS XLARGE (GOWN DISPOSABLE) ×12 IMPLANT
GOWN STRL REUS W/ TWL LRG LVL3 (GOWN DISPOSABLE) ×6 IMPLANT
GOWN STRL REUS W/TWL 2XL LVL3 (GOWN DISPOSABLE) ×4 IMPLANT
GOWN STRL REUS W/TWL LRG LVL3 (GOWN DISPOSABLE) ×6
KIT BASIN OR (CUSTOM PROCEDURE TRAY) ×4 IMPLANT
KIT OSTOMY DRAINABLE 2.75 STR (WOUND CARE) ×4 IMPLANT
KIT PORT POWER 8FR ISP CVUE (Catheter) ×4 IMPLANT
KIT ROOM TURNOVER OR (KITS) ×4 IMPLANT
L-HOOK LAP DISP 36CM (ELECTROSURGICAL) ×4
LHOOK LAP DISP 36CM (ELECTROSURGICAL) ×2 IMPLANT
NEEDLE HYPO 25GX1X1/2 BEV (NEEDLE) ×4 IMPLANT
NS IRRIG 1000ML POUR BTL (IV SOLUTION) ×4 IMPLANT
PACK SURGICAL SETUP 50X90 (CUSTOM PROCEDURE TRAY) ×4 IMPLANT
PAD ARMBOARD 7.5X6 YLW CONV (MISCELLANEOUS) ×8 IMPLANT
PENCIL BUTTON HOLSTER BLD 10FT (ELECTRODE) ×4 IMPLANT
SCISSORS LAP 5X35 DISP (ENDOMECHANICALS) ×4 IMPLANT
SET IRRIG TUBING LAPAROSCOPIC (IRRIGATION / IRRIGATOR) ×4 IMPLANT
SHEARS HARMONIC ACE PLUS 36CM (ENDOMECHANICALS) ×4 IMPLANT
SLEEVE ENDOPATH XCEL 5M (ENDOMECHANICALS) ×8 IMPLANT
SUT MNCRL AB 4-0 PS2 18 (SUTURE) ×4 IMPLANT
SUT MON AB 4-0 PC3 18 (SUTURE) ×4 IMPLANT
SUT PROLENE 2 0 SH DA (SUTURE) ×8 IMPLANT
SUT VIC AB 2-0 SH 18 (SUTURE) ×4 IMPLANT
SUT VIC AB 3-0 SH 18 (SUTURE) ×4 IMPLANT
SUT VIC AB 3-0 SH 27 (SUTURE) ×2
SUT VIC AB 3-0 SH 27X BRD (SUTURE) ×2 IMPLANT
SUT VIC AB 3-0 SH 8-18 (SUTURE) ×8 IMPLANT
SUT VICRYL AB 2 0 TIES (SUTURE) ×4 IMPLANT
SUT VICRYL AB 3 0 TIES (SUTURE) ×4 IMPLANT
SYR 20ML ECCENTRIC (SYRINGE) ×8 IMPLANT
SYR 5ML LUER SLIP (SYRINGE) ×4 IMPLANT
SYR CONTROL 10ML LL (SYRINGE) ×4 IMPLANT
TOWEL OR 17X24 6PK STRL BLUE (TOWEL DISPOSABLE) ×4 IMPLANT
TOWEL OR 17X26 10 PK STRL BLUE (TOWEL DISPOSABLE) ×4 IMPLANT
TRAY LAPAROSCOPIC MC (CUSTOM PROCEDURE TRAY) ×4 IMPLANT
TROCAR BLADELESS 11MM (ENDOMECHANICALS) ×4 IMPLANT
TROCAR BLADELESS 5MM (ENDOMECHANICALS) ×4 IMPLANT
TUBE CONNECTING 12'X1/4 (SUCTIONS) ×1
TUBE CONNECTING 12X1/4 (SUCTIONS) ×3 IMPLANT
TUBING INSUF HEATED (TUBING) ×4 IMPLANT
YANKAUER SUCT BULB TIP NO VENT (SUCTIONS) ×4 IMPLANT

## 2016-06-18 NOTE — Interval H&P Note (Signed)
History and Physical Interval Note:  06/18/2016 7:24 AM  Andre Fernandez  has presented today for surgery, with the diagnosis of COLON CANCER  The various methods of treatment have been discussed with the patient and family. After consideration of risks, benefits and other options for treatment, the patient has consented to  Procedure(s): LAPAROSCOPIC DIVERTED COLOSTOMY (N/A) INSERTION PORT-A-CATH (N/A) as a surgical intervention .  The patient's history has been reviewed, patient examined, no change in status, stable for surgery.  I have reviewed the patient's chart and labs.  Questions were answered to the patient's satisfaction.     Aletha Allebach

## 2016-06-18 NOTE — Progress Notes (Signed)
Pt. Has had no nausea/vomiting today. Output only 25 mL from Ngt. NGT d/c'd per doctor order.

## 2016-06-18 NOTE — Anesthesia Procedure Notes (Signed)
Procedure Name: Intubation Date/Time: 06/18/2016 7:47 AM Performed by: Carney Living Pre-anesthesia Checklist: Patient identified, Emergency Drugs available, Suction available, Patient being monitored and Timeout performed Patient Re-evaluated:Patient Re-evaluated prior to inductionOxygen Delivery Method: Circle system utilized Preoxygenation: Pre-oxygenation with 100% oxygen Intubation Type: IV induction Ventilation: Mask ventilation without difficulty Laryngoscope Size: Mac and 4 Grade View: Grade II Tube type: Oral Tube size: 7.5 mm Number of attempts: 1 Airway Equipment and Method: Stylet Placement Confirmation: ETT inserted through vocal cords under direct vision,  positive ETCO2 and breath sounds checked- equal and bilateral Secured at: 22 cm Tube secured with: Tape Dental Injury: Teeth and Oropharynx as per pre-operative assessment

## 2016-06-18 NOTE — Op Note (Signed)
PRE-OPERATIVE DIAGNOSIS: stage IV colon cancer  POST-OPERATIVE DIAGNOSIS:  Same  PROCEDURE:  Procedure(s): Port placement, left subclavian, ClearVue 8Fr power port, laparoscopic diverting colostomy with splenic flexure takedown  SURGEON:  Surgeon(s): Stark Klein, MD  ASSISTANT: Judyann Munson, RNFA  ANESTHESIA:   local and general  DRAINS: loop colostomy left abdomen   LOCAL MEDICATIONS USED:  BUPIVICAINE  and LIDOCAINE   SPECIMEN:  No Specimen  DISPOSITION OF SPECIMEN:  N/A  COUNTS:  YES  DICTATION: .Dragon Dictation  PLAN OF CARE: Admit to inpatient   PATIENT DISPOSITION:  PACU - hemodynamically stable.  FINDINGS:  Carcinomatosis, catheter at cavoatrial junction  EBL: min  PROCEDURE:    PROCEDURE:  Pt was identified in the holding area and taken to   the operating room, where patient was placed supine on the operating room   table.  General anesthesia was induced.  Patient's arms were tucked and the    chest, neck and abdomen were prepped and draped in sterile fashion.  Time-out was   performed according to the surgical safety check list.  When all was   correct, we continued.   Local anesthetic was administered over this   area at the angle of the clavicle.  The vein was accessed with 1 pass of the needle. There was good venous return and the wire passed easily with no ectopy.   Fluoroscopy was used to confirm that the wire was in the vena cava.      The patient was placed back level and the area for the pocket was anethetized   with local anesthetic.  A 3-cm transverse incision was made with a #15   blade.  Cautery was used to divide the subcutaneous tissues down to the   pectoralis muscle.  An Army[employer]Nav[employer] retractor was used to elevate the skin   while a pocket was created on top of the pectoralis fascia.  The port   was placed into the pocket to confirm that it was of adequate size.  The   catheter was preattached to the port.  The port was then secured to  the   pectoralis fascia with four 2-0 Prolene sutures.  These were clamped and   not tied down yet.    The catheter was tunneled through to the wire exit   site.  The catheter was placed along the wire to determine what length it should be to be in the SVC.  The catheter was cut at 25 cm.  The tunneler sheath and dilator were passed over the wire and the dilator and wire were removed.  The catheter was advanced through the tunneler sheath and the tunneler sheath was pulled away.  Care was taken to keep the catheter in the tunneler sheath as this occurred. This was advanced and the tunneler sheath was removed.  There was good venous   return and easy flush of the catheter.  The Prolene sutures were tied   down to the pectoral fascia.  The skin was reapproximated using 3-0   Vicryl interrupted deep dermal sutures.    Fluoroscopy was used to re-confirm good position of the catheter.  The skin   was then closed using 4-0 Monocryl in a subcuticular fashion.  The port was flushed with concentrated heparin flush as well.    The patient was then placed into reverse trendelenburg position and rotated to the right.  A 5 mm Optiview trocar was placed under direct visualization after administration of local.  The abdomen was  insufflated.  Malignant ascites were seen. No frank perforation was seen.  Significant carcinomatosis burden was seen.  Two 5 mm trocars were placed in the right abdomen. Care was taken to select a site with no mass on the peritoneum.  The colon was very posterior and was difficult to see.  An additional 11 mm port was placed in the left abdomen.  The descending colon was carefully mobilized.  Where the descending colon turns into sigmoid colon was tethered by tumor.  The splenic flexure was taken down.  The peritoneum was very inflamed.  Adequate length was mobilized with difficulty for a loop ostomy.  The descending colon was grasped with a locking grasper via the 10 mm port.  The abdomen was  desufflated.  The 11 mm port was enlarged into a colostomy site.  The colon was pulled through the abdominal wall.    A short length of 14 Fr red rubber catheter was used to create a bridge. This was trans rectus.  The rectus fascia was opened in cruciate fashion.   The ostomy was matured with 3-0 vicryl interrupted sutures.  The port sites were closed with 4-0 monocryl.    The wounds were then cleaned, dried, and dressed with Dermabond.  The patient was awakened from anesthesia and taken to the PACU in stable condition.  Needle, sponge, and instrument counts were correct.

## 2016-06-18 NOTE — Anesthesia Postprocedure Evaluation (Signed)
Anesthesia Post Note  Patient: Andre Fernandez  Procedure(s) Performed: Procedure(s) (LRB): LAPAROSCOPIC DIVERTED COLOSTOMY (N/A) INSERTION PORT-A-CATH (N/A)  Patient location during evaluation: PACU Anesthesia Type: General Level of consciousness: awake and alert Pain management: pain level controlled Vital Signs Assessment: post-procedure vital signs reviewed and stable Respiratory status: spontaneous breathing, nonlabored ventilation and respiratory function stable Cardiovascular status: blood pressure returned to baseline and stable Postop Assessment: no signs of nausea or vomiting Anesthetic complications: no    Last Vitals:  Vitals:   06/18/16 1226 06/18/16 1241  BP: 135/88 130/78  Pulse: 100 98  Resp: (!) 23 18  Temp: 36.7 C 36.4 C    Last Pain:  Vitals:   06/18/16 1241  TempSrc: Oral  PainSc:                  [name],W. EDMOND

## 2016-06-18 NOTE — Transfer of Care (Signed)
Immediate Anesthesia Transfer of Care Note  Patient: Andre Fernandez  Procedure(s) Performed: Procedure(s): LAPAROSCOPIC DIVERTED COLOSTOMY (N/A) INSERTION PORT-A-CATH (N/A)  Patient Location: PACU  Anesthesia Type:General  Level of Consciousness: awake, alert , oriented and patient cooperative  Airway & Oxygen Therapy: Patient Spontanous Breathing and Patient connected to nasal cannula oxygen  Post-op Assessment: Report given to RN, Post -op Vital signs reviewed and stable and Patient moving all extremities X 4  Post vital signs: Reviewed and stable  Last Vitals:  Vitals:   06/18/16 0650  BP: 125/89  Pulse: 98  Resp: 18    Last Pain:  Vitals:   06/18/16 0711  PainSc: 0-No pain      Patients Stated Pain Goal: 2 ([id number])  Complications: No apparent anesthesia complications

## 2016-06-19 LAB — CBC
HCT: 32.5 % — ABNORMAL LOW (ref 39.0–52.0)
Hemoglobin: 10.5 g/dL — ABNORMAL LOW (ref 13.0–17.0)
MCH: 25.3 pg — AB (ref 26.0–34.0)
MCHC: 32.3 g/dL (ref 30.0–36.0)
MCV: 78.3 fL (ref 78.0–100.0)
PLATELETS: 467 10*3/uL — AB (ref 150–400)
RBC: 4.15 MIL/uL — AB (ref 4.22–5.81)
RDW: 16 % — ABNORMAL HIGH (ref 11.5–15.5)
WBC: 20.5 10*3/uL — AB (ref 4.0–10.5)

## 2016-06-19 LAB — BASIC METABOLIC PANEL
Anion gap: 8 (ref 5–15)
BUN: 11 mg/dL (ref 6–20)
CO2: 26 mmol/L (ref 22–32)
Calcium: 8.8 mg/dL — ABNORMAL LOW (ref 8.9–10.3)
Chloride: 97 mmol/L — ABNORMAL LOW (ref 101–111)
Creatinine, Ser: 1.2 mg/dL (ref 0.61–1.24)
GFR calc Af Amer: >60 mL/min (ref >60)
GLUCOSE: 122 mg/dL — AB (ref 65–99)
POTASSIUM: 5.2 mmol/L — AB (ref 3.5–5.1)
SODIUM: 131 mmol/L — AB (ref 135–145)

## 2016-06-19 MED ORDER — SODIUM CHLORIDE 0.9 % IV BOLUS (SEPSIS)
1000.0000 mL | Freq: Once | INTRAVENOUS | Status: AC
  Administered 2016-06-19: 1000 mL via INTRAVENOUS

## 2016-06-19 MED ORDER — PHENOL 1.4 % MT LIQD
1.0000 | OROMUCOSAL | Status: DC | PRN
  Administered 2016-06-19: 1 via OROMUCOSAL
  Filled 2016-06-19: qty 177

## 2016-06-19 NOTE — Progress Notes (Signed)
  Progress Note: General Surgery Service   Subjective: Pain well controlled, no n/v  Objective: Vital signs in last 24 hours: Temp:  [97.6 F (36.4 C)-99.9 F (37.7 C)] 99.2 F (37.3 C) (08/05 0635) Pulse Rate:  [77-120] 111 (08/05 0635) Resp:  [16-23] 18 (08/05 0635) BP: (109-139)/(72-88) 110/81 (08/05 0635) SpO2:  [95 %-100 %] 95 % (08/05 0635)    Intake/Output from previous day: 08/04 0701 - 08/05 0700 In: 3300 [I.V.:3300] Out: 1310 [Urine:1025; Emesis/NG output:10; Stool:250] Intake/Output this shift: No intake/output data recorded.  Lungs: CTAB  Cardiovascular: RRR  Abd: soft, slight distension, nontender, ostomy edematous  Extremities: no edema  Neuro: AOx4  Lab Results: CBC   Recent Labs  06/18/16 1254 06/19/16 0257  WBC 19.7* 20.5*  HGB 11.0* 10.5*  HCT 34.0* 32.5*  PLT 461* 467*   BMET  Recent Labs  06/16/16 1335 06/18/16 1254 06/19/16 0257  NA 136  --  131*  K 4.1  --  5.2*  CL 98*  --  97*  CO2 28  --  26  GLUCOSE 121*  --  122*  BUN 7  --  11  CREATININE 0.87 0.90 1.20  CALCIUM 9.0  --  8.8*   PT/INR No results for input(s): LABPROT, INR in the last 72 hours. ABG No results for input(s): PHART, HCO3 in the last 72 hours.  Invalid input(s): PCO2, PO2  Studies/Results:  Anti-infectives: Anti-infectives    Start     Dose/Rate Route Frequency Ordered Stop   06/18/16 1300  amoxicillin-clavulanate (AUGMENTIN) 500-125 MG per tablet 500 mg     500 mg Oral 2 times daily 06/18/16 1240     06/18/16 0729  dextrose 5 % with cefOXitin (MEFOXIN) ADS Med    Comments:  [name]   : cabinet override      06/18/16 0729 06/18/16 1944   06/18/16 0702  ceFAZolin (ANCEF) 2-4 GM/100ML-% IVPB  Status:  Discontinued    Comments:  [name]   : cabinet override      06/18/16 0702 06/18/16 0735   06/18/16 0701  ceFAZolin (ANCEF) IVPB 2g/100 mL premix  Status:  Discontinued     2 g 200 mL/hr over 30 Minutes Intravenous On call to O.R.  06/18/16 0701 06/18/16 1238      Medications: Scheduled Meds: . amoxicillin-clavulanate  500 mg Oral BID  . bisoprolol-hydrochlorothiazide  1 tablet Oral BH-q7a  . enoxaparin (LOVENOX) injection  40 mg Subcutaneous Q24H  . latanoprost  1 drop Both Eyes QHS  . polyethylene glycol powder  17 g Oral Daily  . senna-docusate  1 tablet Oral BID   Continuous Infusions: . dextrose 5 % and 0.45 % NaCl with KCl 20 mEq/L 100 mL/hr at 06/19/16 0954   PRN Meds:.acetaminophen, diphenhydrAMINE **OR** diphenhydrAMINE, HYDROmorphone (DILAUDID) injection, methocarbamol, ondansetron **OR** ondansetron (ZOFRAN) IV, oxyCODONE-acetaminophen, simethicone, zolpidem  Assessment/Plan: Patient Active Problem List   Diagnosis Date Noted  . Colon cancer metastasized to multiple sites [facility]) 06/18/2016   s/p Procedure(s): LAPAROSCOPIC DIVERTED COLOSTOMY INSERTION PORT-A-CATH 06/18/2016 POD 1 -clears today -1L bolus for increased creatinine, continue foley today for strict I/Os -up to chair today   LOS: 1 day   Mickeal Skinner, MD Pg# [phone] [facility]

## 2016-06-20 LAB — BASIC METABOLIC PANEL
ANION GAP: 9 (ref 5–15)
BUN: 10 mg/dL (ref 6–20)
CO2: 27 mmol/L (ref 22–32)
Calcium: 9.2 mg/dL (ref 8.9–10.3)
Chloride: 97 mmol/L — ABNORMAL LOW (ref 101–111)
Creatinine, Ser: 0.99 mg/dL (ref 0.61–1.24)
GFR calc Af Amer: >60 mL/min (ref >60)
GFR calc non Af Amer: >60 mL/min (ref >60)
GLUCOSE: 111 mg/dL — AB (ref 65–99)
POTASSIUM: 4.5 mmol/L (ref 3.5–5.1)
Sodium: 133 mmol/L — ABNORMAL LOW (ref 135–145)

## 2016-06-20 LAB — CBC
HCT: 32.5 % — ABNORMAL LOW (ref 39.0–52.0)
Hemoglobin: 10.5 g/dL — ABNORMAL LOW (ref 13.0–17.0)
MCH: 25.8 pg — AB (ref 26.0–34.0)
MCHC: 32.3 g/dL (ref 30.0–36.0)
MCV: 79.9 fL (ref 78.0–100.0)
PLATELETS: 450 10*3/uL — AB (ref 150–400)
RBC: 4.07 MIL/uL — AB (ref 4.22–5.81)
RDW: 16.2 % — AB (ref 11.5–15.5)
WBC: 22.1 10*3/uL — AB (ref 4.0–10.5)

## 2016-06-20 MED ORDER — FAMOTIDINE IN NACL 20-0.9 MG/50ML-% IV SOLN
20.0000 mg | INTRAVENOUS | Status: DC
  Administered 2016-06-20 – 2016-06-23 (×4): 20 mg via INTRAVENOUS
  Filled 2016-06-20 (×6): qty 50

## 2016-06-20 NOTE — Progress Notes (Signed)
2 Days Post-Op  Subjective: Some reflux, no n/v, up some  Objective: Vital signs in last 24 hours: Temp:  [99.2 F (37.3 C)-100.1 F (37.8 C)] 100 F (37.8 C) (08/06 0429) Pulse Rate:  [97-110] 97 (08/06 0429) Resp:  [19-24] 19 (08/06 0429) BP: (115-128)/(77-88) 121/88 (08/06 0429) SpO2:  [97 %-98 %] 98 % (08/06 0429) Last BM Date:  (pre op)  Intake/Output from previous day: 08/05 0701 - 08/06 0700 In: 2100 [I.V.:2100] Out: 1475 [Urine:1475] Intake/Output this shift: Total I/O In: 173.3 [I.V.:173.3] Out: -   General appearance: no distress Resp: clear to auscultation bilaterally Cardio: regular rate and rhythm GI: mild distention, few bs, stoma with some edema no output yet approp tender  Lab Results:   Recent Labs  06/19/16 0257 06/20/16 0410  WBC 20.5* 22.1*  HGB 10.5* 10.5*  HCT 32.5* 32.5*  PLT 467* 450*   BMET  Recent Labs  06/19/16 0257 06/20/16 0410  NA 131* 133*  K 5.2* 4.5  CL 97* 97*  CO2 26 27  GLUCOSE 122* 111*  BUN 11 10  CREATININE 1.20 0.99  CALCIUM 8.8* 9.2   PT/INR No results for input(s): LABPROT, INR in the last 72 hours. ABG No results for input(s): PHART, HCO3 in the last 72 hours.  Invalid input(s): PCO2, PO2  Studies/Results: Dg Chest Port 1 View  Result Date: 06/18/2016 CLINICAL DATA:  Intra operative Port-A-Cath placement. Current history of metastatic colon cancer. EXAM: PORTABLE CHEST 1 VIEW COMPARISON:  06/16/2016. FINDINGS: Left subclavian Port-A-Cath tip projects over the expected location of the cavoatrial junction. No evidence of pneumothorax or mediastinal hematoma. Lungs clear. Interval improvement in the small bilateral pleural effusions noted 2 days ago. Cardiomediastinal silhouette unremarkable, unchanged. IMPRESSION: 1. Left subclavian Port-A-Cath tip projects over the expected location of the cavoatrial junction. No acute complicating features. 2. Improved bilateral pleural effusions. No new/acute abnormalities.  Electronically Signed   By: Evangeline Dakin M.D.   On: 06/18/2016 11:35    Anti-infectives: Anti-infectives    Start     Dose/Rate Route Frequency Ordered Stop   06/18/16 1300  amoxicillin-clavulanate (AUGMENTIN) 500-125 MG per tablet 500 mg     500 mg Oral 2 times daily 06/18/16 1240     06/18/16 0729  dextrose 5 % with cefOXitin (MEFOXIN) ADS Med    Comments:  [name]   : cabinet override      06/18/16 0729 06/18/16 1944   06/18/16 0702  ceFAZolin (ANCEF) 2-4 GM/100ML-% IVPB  Status:  Discontinued    Comments:  [name]   : cabinet override      06/18/16 0702 06/18/16 0735   06/18/16 0701  ceFAZolin (ANCEF) IVPB 2g/100 mL premix  Status:  Discontinued     2 g 200 mL/hr over 30 Minutes Intravenous On call to O.R. 06/18/16 0701 06/18/16 1238      Assessment/Plan: POD 2 port and diverting stoma -clears today can continue but still has ileus that needs to resolve -cr normal today, foley out now, dtv -up to chair today, pulm toilet -lovenox, scds   Andre Fernandez 06/20/2016

## 2016-06-21 ENCOUNTER — Encounter (HOSPITAL_COMMUNITY): Payer: Self-pay | Admitting: General Practice

## 2016-06-21 NOTE — Progress Notes (Signed)
Patient ID: Andre Fernandez, male   DOB: [birth year], 60 y.o.   MRN: [id number] 3 Days Post-Op  Subjective: Threw up last night and NGT placed.    Objective: Vital signs in last 24 hours: Temp:  [98.3 F (36.8 C)-99 F (37.2 C)] 98.6 F (37 C) (08/07 0404) Pulse Rate:  [102-121] 102 (08/07 0404) Resp:  [19] 19 (08/07 0404) BP: (121-138)/(86-93) 126/87 (08/07 0404) SpO2:  [100 %] 100 % (08/07 0404) Last BM Date:  (new ostomy)  Intake/Output from previous day: 08/06 0701 - 08/07 0700 In: 1957.1 [I.V.:1907.1; IV Piggyback:50] Out: [id number] [Urine:675; Emesis/NG [id number] Intake/Output this shift: No intake/output data recorded.  General appearance: no distress Feculent material in NGT Resp: clear to auscultation bilaterally Cardio: regular rate and rhythm GI: non distended.  Stoma edematous.  No gas in bag, but some liquid stool.    Lab Results:   Recent Labs  06/19/16 0257 06/20/16 0410  WBC 20.5* 22.1*  HGB 10.5* 10.5*  HCT 32.5* 32.5*  PLT 467* 450*   BMET  Recent Labs  06/19/16 0257 06/20/16 0410  NA 131* 133*  K 5.2* 4.5  CL 97* 97*  CO2 26 27  GLUCOSE 122* 111*  BUN 11 10  CREATININE 1.20 0.99  CALCIUM 8.8* 9.2   PT/INR No results for input(s): LABPROT, INR in the last 72 hours. ABG No results for input(s): PHART, HCO3 in the last 72 hours.  Invalid input(s): PCO2, PO2  Studies/Results: No results found.  Anti-infectives: Anti-infectives    Start     Dose/Rate Route Frequency Ordered Stop   06/18/16 1300  amoxicillin-clavulanate (AUGMENTIN) 500-125 MG per tablet 500 mg     500 mg Oral 2 times daily 06/18/16 1240     06/18/16 0729  dextrose 5 % with cefOXitin (MEFOXIN) ADS Med    Comments:  [name]   : cabinet override      06/18/16 0729 06/18/16 1944   06/18/16 0702  ceFAZolin (ANCEF) 2-4 GM/100ML-% IVPB  Status:  Discontinued    Comments:  [name]   : cabinet override      06/18/16 0702 06/18/16 0735   06/18/16 0701  ceFAZolin  (ANCEF) IVPB 2g/100 mL premix  Status:  Discontinued     2 g 200 mL/hr over 30 Minutes Intravenous On call to O.R. 06/18/16 0701 06/18/16 1238      Assessment/Plan: POD 3 port and diverting stoma -continue NGT. -up to chair today, pulm toilet -lovenox, scds   [name] 06/21/2016

## 2016-06-21 NOTE — Consult Note (Addendum)
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  He currently has an NG and is in pain.  Will perform teaching session and pouch change when he is feeling better this week. Stomal assessment/size:  Stoma red and viable with red rubber rod in place when visualized through pouch, which is intact with good seal. Output: Small amt pink drainage in the pouch, no stool or flatus. Ostomy pouching:  2pc.  Education provided: Educational materials left at the bedside and supplies ordered for bedside nurse use. Answered family member's questions. Enrolled patient in [place] program: No Julien Girt MSN, [place], [place], [place], [place]

## 2016-06-21 NOTE — Progress Notes (Signed)
PT Screen  Patient Details Name: Andre Fernandez MRN: [id number] DOB: [birth year]   Cancelled Treatment:    Reason Eval/Treat Not Completed: PT screened, no needs identified, will sign off.  I wittnessed pt walking with family in the hallway >1,500' with RW, supervision.  I helped them adjust his RW up for his height.  He would likely , given his height, benefit from RW and 3-in-1 for discharge.  He is more appropriate for the mobility team than a full on PT consult given how well he is walking with his family.  RN made aware.  PT to sign off.  Re-consult [place] if needed.   Thanks,    Barbarann Ehlers. [place], [place], DPT [phone]   06/21/2016, 4:26 PM

## 2016-06-22 LAB — CBC
HCT: 32.2 % — ABNORMAL LOW (ref 39.0–52.0)
Hemoglobin: 10.4 g/dL — ABNORMAL LOW (ref 13.0–17.0)
MCH: 25.7 pg — ABNORMAL LOW (ref 26.0–34.0)
MCHC: 32.3 g/dL (ref 30.0–36.0)
MCV: 79.7 fL (ref 78.0–100.0)
PLATELETS: 453 10*3/uL — AB (ref 150–400)
RBC: 4.04 MIL/uL — ABNORMAL LOW (ref 4.22–5.81)
RDW: 16.5 % — AB (ref 11.5–15.5)
WBC: 19.6 10*3/uL — AB (ref 4.0–10.5)

## 2016-06-22 LAB — BASIC METABOLIC PANEL
Anion gap: 7 (ref 5–15)
BUN: 11 mg/dL (ref 6–20)
CHLORIDE: 98 mmol/L — AB (ref 101–111)
CO2: 31 mmol/L (ref 22–32)
Calcium: 9.5 mg/dL (ref 8.9–10.3)
Creatinine, Ser: 0.86 mg/dL (ref 0.61–1.24)
GFR calc Af Amer: >60 mL/min (ref >60)
GFR calc non Af Amer: >60 mL/min (ref >60)
Glucose, Bld: 105 mg/dL — ABNORMAL HIGH (ref 65–99)
POTASSIUM: 4.3 mmol/L (ref 3.5–5.1)
SODIUM: 136 mmol/L (ref 135–145)

## 2016-06-22 NOTE — Progress Notes (Signed)
[place] Nurse ostomy follow up Stoma type/location:  Patient had colostomy surgery done on 8/4. Had f/u visit today for first pouch change and teaching Stomal assessment/size: Stoma swollen with moderate amount of mucous, red rubber rod in place, stoma red Peristomal assessment: skin intact Treatment options for stomal/peristomal skin:  Output Small amount of pink drainage noted in bag. No flatus noted in bag but patient stated he had a small bowel movement via rectum yesterday Ostomy pouching: 2pc.  Education provided: Patient did return demonstration on how to fold ostomy bag and attach it. Discussed dietary needs, clothing and life skills after discharge. Patient had no questions at this time but family was present for first ostomy change and they are very receptive to learning. They asked questions related to applying bag, how frequently it should be changed, dietary restrictions if applicable, and how to clean ostomy site and bag. WOC will continue to provide teaching to patient and family. Patient stated he will read education book left at bedside.To be enrolled in [place] program Enrolled patient in [place] Discharge program: No  WOC team will follow Thanks, Melba Coon MSN, RN, [employer]

## 2016-06-22 NOTE — Progress Notes (Signed)
Patient ID: Andre Fernandez, male   DOB: 08-16-1956, 60 y.o.   MRN: [id number] 4 Days Post-Op  Subjective: Feeling a bit better.    Objective: Vital signs in last 24 hours: Temp:  [98 F (36.7 C)-98.5 F (36.9 C)] 98.4 F (36.9 C) (08/08 0627) Pulse Rate:  [101-104] 101 (08/08 0627) Resp:  [19] 19 (08/08 0627) BP: (118-129)/(79-84) 122/81 (08/08 0627) SpO2:  [99 %-100 %] 99 % (08/08 0627) Last BM Date:  (new ostomy)  Intake/Output from previous day: 08/07 0701 - 08/08 0700 In: 1773.8 [I.V.:1693.8; NG/GT:30; IV Piggyback:50] Out: 1500 [Urine:400; Emesis/NG output:1100] Intake/Output this shift: No intake/output data recorded.  General appearance: no distress Bilious material in NGT Resp: breathing comfortably Chest: port site looks good.   GI: non distended.  Stoma edematous still.  No gas in bag.    Lab Results:   Recent Labs  06/20/16 0410 06/22/16 1243  WBC 22.1* 19.6*  HGB 10.5* 10.4*  HCT 32.5* 32.2*  PLT 450* 453*   BMET  Recent Labs  06/20/16 0410 06/22/16 1243  NA 133* 136  K 4.5 4.3  CL 97* 98*  CO2 27 31  GLUCOSE 111* 105*  BUN 10 11  CREATININE 0.99 0.86  CALCIUM 9.2 9.5   PT/INR No results for input(s): LABPROT, INR in the last 72 hours. ABG No results for input(s): PHART, HCO3 in the last 72 hours.  Invalid input(s): PCO2, PO2  Studies/Results: No results found.  Anti-infectives: Anti-infectives    Start     Dose/Rate Route Frequency Ordered Stop   06/18/16 1300  amoxicillin-clavulanate (AUGMENTIN) 500-125 MG per tablet 500 mg     500 mg Oral 2 times daily 06/18/16 1240     06/18/16 0729  dextrose 5 % with cefOXitin (MEFOXIN) ADS Med    Comments:  [name]   : cabinet override      06/18/16 0729 06/18/16 1944   06/18/16 0702  ceFAZolin (ANCEF) 2-4 GM/100ML-% IVPB  Status:  Discontinued    Comments:  [name]   : cabinet override      06/18/16 0702 06/18/16 0735   06/18/16 0701  ceFAZolin (ANCEF) IVPB 2g/100 mL premix   Status:  Discontinued     2 g 200 mL/hr over 30 Minutes Intravenous On call to O.R. 06/18/16 0701 06/18/16 1238      Assessment/Plan: POD 4 port and diverting stoma -continue NGT. -up to chair today, pulm toilet -lovenox, scds   Andre Fernandez 06/22/2016

## 2016-06-23 LAB — CBC
HEMATOCRIT: 29.5 % — AB (ref 39.0–52.0)
HEMOGLOBIN: 9.4 g/dL — AB (ref 13.0–17.0)
MCH: 25.5 pg — AB (ref 26.0–34.0)
MCHC: 31.9 g/dL (ref 30.0–36.0)
MCV: 80.2 fL (ref 78.0–100.0)
Platelets: 334 10*3/uL (ref 150–400)
RBC: 3.68 MIL/uL — ABNORMAL LOW (ref 4.22–5.81)
RDW: 16.7 % — AB (ref 11.5–15.5)
WBC: 19.2 10*3/uL — ABNORMAL HIGH (ref 4.0–10.5)

## 2016-06-23 LAB — BASIC METABOLIC PANEL
Anion gap: 7 (ref 5–15)
BUN: 10 mg/dL (ref 6–20)
CALCIUM: 8.8 mg/dL — AB (ref 8.9–10.3)
CHLORIDE: 99 mmol/L — AB (ref 101–111)
CO2: 28 mmol/L (ref 22–32)
CREATININE: 0.76 mg/dL (ref 0.61–1.24)
GFR calc non Af Amer: >60 mL/min (ref >60)
GLUCOSE: 101 mg/dL — AB (ref 65–99)
Potassium: 4.1 mmol/L (ref 3.5–5.1)
Sodium: 134 mmol/L — ABNORMAL LOW (ref 135–145)

## 2016-06-23 NOTE — Progress Notes (Signed)
Patient ID: Andre Fernandez, male   DOB: [birth year], 60 y.o.   MRN: [id number] 5 Days Post-Op  Subjective: Continues to feel better.  NGT output down.    Objective: Vital signs in last 24 hours: Temp:  [98.8 F (37.1 C)-99 F (37.2 C)] 98.8 F (37.1 C) (08/09 0604) Pulse Rate:  [96-105] 101 (08/09 0604) Resp:  [16-19] 16 (08/09 0604) BP: (117-136)/(75-88) 117/80 (08/09 0604) SpO2:  [97 %-99 %] 97 % (08/09 0604) Last BM Date:  (new ostomy)  Intake/Output from previous day: 08/08 0701 - 08/09 0700 In: 744 [I.V.:744] Out: 825 [Urine:575; Emesis/NG output:250] Intake/Output this shift: No intake/output data recorded.  General appearance: no distress Decreased NGT output, bilious.  Resp: breathing comfortably Chest: port site looks good.   GI: non distended.  Stoma edematous still.  Gas in bag.    Lab Results:   Recent Labs  06/22/16 1243 06/23/16 0652  WBC 19.6* 19.2*  HGB 10.4* 9.4*  HCT 32.2* 29.5*  PLT 453* 334   BMET  Recent Labs  06/22/16 1243 06/23/16 0652  NA 136 134*  K 4.3 4.1  CL 98* 99*  CO2 31 28  GLUCOSE 105* 101*  BUN 11 10  CREATININE 0.86 0.76  CALCIUM 9.5 8.8*   PT/INR No results for input(s): LABPROT, INR in the last 72 hours. ABG No results for input(s): PHART, HCO3 in the last 72 hours.  Invalid input(s): PCO2, PO2  Studies/Results: No results found.  Anti-infectives: Anti-infectives    Start     Dose/Rate Route Frequency Ordered Stop   06/18/16 1300  amoxicillin-clavulanate (AUGMENTIN) 500-125 MG per tablet 500 mg     500 mg Oral 2 times daily 06/18/16 1240     06/18/16 0729  dextrose 5 % with cefOXitin (MEFOXIN) ADS Med    Comments:  [name]   : cabinet override      06/18/16 0729 06/18/16 1944   06/18/16 0702  ceFAZolin (ANCEF) 2-4 GM/100ML-% IVPB  Status:  Discontinued    Comments:  [name]   : cabinet override      06/18/16 0702 06/18/16 0735   06/18/16 0701  ceFAZolin (ANCEF) IVPB 2g/100 mL premix  Status:   Discontinued     2 g 200 mL/hr over 30 Minutes Intravenous On call to O.R. 06/18/16 0701 06/18/16 1238      Assessment/Plan: POD 5 port and diverting stoma -d/c NGT, advance to clear liquids.   -up to chair today, pulm toilet -lovenox, scds   [name] 06/23/2016

## 2016-06-24 LAB — BASIC METABOLIC PANEL
Anion gap: 7 (ref 5–15)
BUN: 8 mg/dL (ref 6–20)
CHLORIDE: 95 mmol/L — AB (ref 101–111)
CO2: 30 mmol/L (ref 22–32)
CREATININE: 0.83 mg/dL (ref 0.61–1.24)
Calcium: 8.9 mg/dL (ref 8.9–10.3)
GFR calc Af Amer: >60 mL/min (ref >60)
GLUCOSE: 114 mg/dL — AB (ref 65–99)
Potassium: 4.4 mmol/L (ref 3.5–5.1)
Sodium: 132 mmol/L — ABNORMAL LOW (ref 135–145)

## 2016-06-24 LAB — CBC
HEMATOCRIT: 30.9 % — AB (ref 39.0–52.0)
HEMOGLOBIN: 9.9 g/dL — AB (ref 13.0–17.0)
MCH: 25.5 pg — AB (ref 26.0–34.0)
MCHC: 32 g/dL (ref 30.0–36.0)
MCV: 79.6 fL (ref 78.0–100.0)
Platelets: 399 10*3/uL (ref 150–400)
RBC: 3.88 MIL/uL — ABNORMAL LOW (ref 4.22–5.81)
RDW: 16.6 % — ABNORMAL HIGH (ref 11.5–15.5)
WBC: 20 10*3/uL — ABNORMAL HIGH (ref 4.0–10.5)

## 2016-06-24 MED ORDER — FAMOTIDINE 20 MG PO TABS
20.0000 mg | ORAL_TABLET | Freq: Every day | ORAL | Status: DC
  Administered 2016-06-24 – 2016-06-26 (×3): 20 mg via ORAL
  Filled 2016-06-24 (×3): qty 1

## 2016-06-24 MED ORDER — CHLORPROMAZINE HCL 25 MG/ML IJ SOLN
25.0000 mg | Freq: Three times a day (TID) | INTRAMUSCULAR | Status: DC | PRN
  Filled 2016-06-24: qty 1

## 2016-06-24 NOTE — Progress Notes (Signed)
Patient ID: Andre Fernandez, male   DOB: 08/15/1956, 60 y.o.   MRN: [id number] 6 Days Post-Op  Subjective: Tolerated NGT out and clears. Having hiccups.    Objective: Vital signs in last 24 hours: Temp:  [99.5 F (37.5 C)-99.8 F (37.7 C)] 99.7 F (37.6 C) (08/10 1325) Pulse Rate:  [104-108] 105 (08/10 1325) Resp:  [16-18] 18 (08/10 1325) BP: (119-124)/(80-86) 119/86 (08/10 1325) SpO2:  [95 %-97 %] 97 % (08/10 1325) Last BM Date:  (Colostomy)  Intake/Output from previous day: 08/09 0701 - 08/10 0700 In: 2212.5 [P.O.:440; I.V.:1692.5; NG/GT:30; IV Piggyback:50] Out: 675 [Urine:675] Intake/Output this shift: Total I/O In: 1145 [P.O.:360; I.V.:785] Out: 350 [Stool:350]  General appearance: no distress  Resp: breathing comfortably Chest: port site looks good.   GI: non distended.  Stoma edematous still.  Gas in bag.    Lab Results:   Recent Labs  06/23/16 0652 06/24/16 0542  WBC 19.2* 20.0*  HGB 9.4* 9.9*  HCT 29.5* 30.9*  PLT 334 399   BMET  Recent Labs  06/23/16 0652 06/24/16 0542  NA 134* 132*  K 4.1 4.4  CL 99* 95*  CO2 28 30  GLUCOSE 101* 114*  BUN 10 8  CREATININE 0.76 0.83  CALCIUM 8.8* 8.9   PT/INR No results for input(s): LABPROT, INR in the last 72 hours. ABG No results for input(s): PHART, HCO3 in the last 72 hours.  Invalid input(s): PCO2, PO2  Studies/Results: No results found.  Anti-infectives: Anti-infectives    Start     Dose/Rate Route Frequency Ordered Stop   06/18/16 1300  amoxicillin-clavulanate (AUGMENTIN) 500-125 MG per tablet 500 mg     500 mg Oral 2 times daily 06/18/16 1240     06/18/16 0729  dextrose 5 % with cefOXitin (MEFOXIN) ADS Med    Comments:  [name]   : cabinet override      06/18/16 0729 06/18/16 1944   06/18/16 0702  ceFAZolin (ANCEF) 2-4 GM/100ML-% IVPB  Status:  Discontinued    Comments:  [name]   : cabinet override      06/18/16 0702 06/18/16 0735   06/18/16 0701  ceFAZolin (ANCEF) IVPB  2g/100 mL premix  Status:  Discontinued     2 g 200 mL/hr over 30 Minutes Intravenous On call to O.R. 06/18/16 0701 06/18/16 1238      Assessment/Plan: POD 6 port and diverting stoma Full liquids -up to chair today, pulm toilet -lovenox, scds   Steward Sames 06/24/2016

## 2016-06-25 LAB — BASIC METABOLIC PANEL
Anion gap: 6 (ref 5–15)
BUN: 6 mg/dL (ref 6–20)
CHLORIDE: 96 mmol/L — AB (ref 101–111)
CO2: 27 mmol/L (ref 22–32)
CREATININE: 0.75 mg/dL (ref 0.61–1.24)
Calcium: 8.7 mg/dL — ABNORMAL LOW (ref 8.9–10.3)
GFR calc Af Amer: >60 mL/min (ref >60)
GFR calc non Af Amer: >60 mL/min (ref >60)
GLUCOSE: 112 mg/dL — AB (ref 65–99)
POTASSIUM: 4.2 mmol/L (ref 3.5–5.1)
Sodium: 129 mmol/L — ABNORMAL LOW (ref 135–145)

## 2016-06-25 LAB — CBC
HEMATOCRIT: 29.3 % — AB (ref 39.0–52.0)
HEMOGLOBIN: 9.4 g/dL — AB (ref 13.0–17.0)
MCH: 25.5 pg — ABNORMAL LOW (ref 26.0–34.0)
MCHC: 32.1 g/dL (ref 30.0–36.0)
MCV: 79.6 fL (ref 78.0–100.0)
Platelets: 383 10*3/uL (ref 150–400)
RBC: 3.68 MIL/uL — ABNORMAL LOW (ref 4.22–5.81)
RDW: 16.5 % — AB (ref 11.5–15.5)
WBC: 18.5 10*3/uL — AB (ref 4.0–10.5)

## 2016-06-25 NOTE — Progress Notes (Signed)
Patient ID: Andre Fernandez, male   DOB: 1956-07-22, 60 y.o.   MRN: [id number] 7 Days Post-Op   Subjective: Fewer hiccups.  Denies n/v.  Had a lot of stool via ostomy bag.    Objective: Vital signs in last 24 hours: Temp:  [98.4 F (36.9 C)-99.7 F (37.6 C)] 98.7 F (37.1 C) (08/11 0635) Pulse Rate:  [93-105] 105 (08/11 0635) Resp:  [18] 18 (08/11 0635) BP: (119-133)/(77-90) 133/90 (08/11 0635) SpO2:  [97 %-100 %] 100 % (08/11 0635) Last BM Date:  (Colostomy)  Intake/Output from previous day: 08/10 0701 - 08/11 0700 In: 1970 [P.O.:360; I.V.:1610] Out: 900 [Urine:400; Stool:500] Intake/Output this shift: Total I/O In: 318.8 [P.O.:60; I.V.:258.8] Out: 250 [Urine:250]  General appearance: no distress  Resp: breathing comfortably Chest: port site looks good.   GI: non distended.  Gas and stool in bag.    Lab Results:   Recent Labs  06/24/16 0542 06/25/16 0518  WBC 20.0* 18.5*  HGB 9.9* 9.4*  HCT 30.9* 29.3*  PLT 399 383   BMET  Recent Labs  06/24/16 0542 06/25/16 0518  NA 132* 129*  K 4.4 4.2  CL 95* 96*  CO2 30 27  GLUCOSE 114* 112*  BUN 8 6  CREATININE 0.83 0.75  CALCIUM 8.9 8.7*   PT/INR No results for input(s): LABPROT, INR in the last 72 hours. ABG No results for input(s): PHART, HCO3 in the last 72 hours.  Invalid input(s): PCO2, PO2  Studies/Results: No results found.  Anti-infectives: Anti-infectives    Start     Dose/Rate Route Frequency Ordered Stop   06/18/16 1300  amoxicillin-clavulanate (AUGMENTIN) 500-125 MG per tablet 500 mg     500 mg Oral 2 times daily 06/18/16 1240     06/18/16 0729  dextrose 5 % with cefOXitin (MEFOXIN) ADS Med    Comments:  [name]   : cabinet override      06/18/16 0729 06/18/16 1944   06/18/16 0702  ceFAZolin (ANCEF) 2-4 GM/100ML-% IVPB  Status:  Discontinued    Comments:  [name]   : cabinet override      06/18/16 0702 06/18/16 0735   06/18/16 0701  ceFAZolin (ANCEF) IVPB 2g/100 mL premix   Status:  Discontinued     2 g 200 mL/hr over 30 Minutes Intravenous On call to O.R. 06/18/16 0701 06/18/16 1238      Assessment/Plan: POD 7 port and diverting stoma Regular diet. Work toward home health for stoma care.   Ambulate. Saline lock IVF   [facility] 06/25/2016

## 2016-06-25 NOTE — Consult Note (Signed)
[place] Nurse ostomy follow up Stoma type/location: LLQ Colostomy with red rubber cath rod in place.  Stomal assessment/size: 1 3/4" edematous, pink patent and producing soft brown stool.  Peristomal assessment: Intact Treatment options for stomal/peristomal skin: None Output Soft brown stool Ostomy pouching: 2pc. 2 3/4" pouch.    Education provided: Patient cut barrier to fit and applied appliance today with coaching from [facility] nurse.  Understands to empy when 1/3 full and pouch changes twice weekly.  Demonstrated roll closure today.  HAs 5 pouches and 5 barriers in room. Educational materials left in room for patient.  Enrolled patient in [place] Start Discharge program: Yes Card signed today.  Will not follow at this time.  Please re-consult if needed.  Domenic Moras RN BSN [place] Pager [phone]

## 2016-06-25 NOTE — Care Management Note (Signed)
Case Management Note  Patient Details  Name: Andre Fernandez MRN: [id number] Date of Birth: 02-Apr-1956  Subjective/Objective:                    Action/Plan:  Patient with patient and family at bedside . Patient discharge address is [address]   His cell number is [phone] Expected Discharge Date:                  Expected Discharge Plan:  [place]  In-House Referral:     Discharge planning Services  CM Consult  Post Acute Care Choice:  Home Health Choice offered to:  Patient  DME Arranged:    DME Agency:     HH Arranged:  RN [place] Agency:  [place]  Status of Service:  Completed, signed off  If discussed at [place] of Stay Meetings, dates discussed:    Additional Comments:  Marilu Favre, RN 06/25/2016, 11:20 AM

## 2016-06-26 LAB — CBC
HEMATOCRIT: 30.5 % — AB (ref 39.0–52.0)
HEMOGLOBIN: 9.9 g/dL — AB (ref 13.0–17.0)
MCH: 25.4 pg — AB (ref 26.0–34.0)
MCHC: 32.5 g/dL (ref 30.0–36.0)
MCV: 78.2 fL (ref 78.0–100.0)
Platelets: 426 10*3/uL — ABNORMAL HIGH (ref 150–400)
RBC: 3.9 MIL/uL — ABNORMAL LOW (ref 4.22–5.81)
RDW: 16.5 % — AB (ref 11.5–15.5)
WBC: 17.8 10*3/uL — ABNORMAL HIGH (ref 4.0–10.5)

## 2016-06-26 LAB — BASIC METABOLIC PANEL
Anion gap: 9 (ref 5–15)
BUN: 9 mg/dL (ref 6–20)
CALCIUM: 8.9 mg/dL (ref 8.9–10.3)
CHLORIDE: 96 mmol/L — AB (ref 101–111)
CO2: 27 mmol/L (ref 22–32)
CREATININE: 0.7 mg/dL (ref 0.61–1.24)
GFR calc non Af Amer: >60 mL/min (ref >60)
Glucose, Bld: 94 mg/dL (ref 65–99)
Potassium: 4.3 mmol/L (ref 3.5–5.1)
Sodium: 132 mmol/L — ABNORMAL LOW (ref 135–145)

## 2016-06-26 MED ORDER — AMOXICILLIN-POT CLAVULANATE 500-125 MG PO TABS: 500.0000 mg | ORAL_TABLET | Freq: Two times a day (BID) | ORAL | 0 refills | Status: AC

## 2016-06-26 MED ORDER — METHOCARBAMOL 500 MG PO TABS: 500.0000 mg | ORAL_TABLET | Freq: Three times a day (TID) | ORAL | 0 refills | Status: AC | PRN

## 2016-06-26 MED ORDER — OXYCODONE-ACETAMINOPHEN 5-325 MG PO TABS: 1.0000 | ORAL_TABLET | ORAL | 0 refills | Status: AC | PRN

## 2016-06-26 NOTE — Progress Notes (Signed)
CM received call from RN requesting 3n1 and rolling walker for pt.  CM called [facility] DME rep, Reggie to please deliver the DME to pt's room so pt can discharge.  CM notified [facility] rep tiffany for [facility] services.  NO other CM needs were communicated.

## 2016-06-26 NOTE — Discharge Instructions (Signed)

## 2016-06-26 NOTE — Progress Notes (Signed)
[facility] Progress Note  8 Days Post-Op  Subjective: Mild pain round incision sites. Tolerating PO without N/V. WOC helped patient apply ostomy pouch yesterday and showed him how to empty it. Patient wife at bedside requesting to talk to Andre Fernandez from [place] specific type of dressing to cover ostomy with during showers.   Objective: Vital signs in last 24 hours: Temp:  [98.1 F (36.7 C)-99.5 F (37.5 C)] 98.1 F (36.7 C) (08/12 0429) Pulse Rate:  [92-108] 96 (08/12 0429) Resp:  [16-18] 16 (08/12 0429) BP: (105-126)/(75-90) 105/79 (08/12 0429) SpO2:  [97 %-99 %] 98 % (08/12 0429) Last BM Date:  (colostomy)  Intake/Output from previous day: 08/11 0701 - 08/12 0700 In: 558.8 [P.O.:300; I.V.:258.8] Out: 450 [Urine:450] Intake/Output this shift: No intake/output data recorded.  PE: Gen:  Alert, NAD, pleasant Card:  RRR, no M/G/R  Pulm:  Port site healing appropriately without erythema. CTA, no W/R/R  Abd: Soft, NT/ND, +BS, gas and stool in LLQ bag  Lab Results:   Recent Labs  06/25/16 0518 06/26/16 0450  WBC 18.5* 17.8*  HGB 9.4* 9.9*  HCT 29.3* 30.5*  PLT 383 426*   BMET  Recent Labs  06/25/16 0518 06/26/16 0450  NA 129* 132*  K 4.2 4.3  CL 96* 96*  CO2 27 27  GLUCOSE 112* 94  BUN 6 9  CREATININE 0.75 0.70  CALCIUM 8.7* 8.9   PT/INR No results for input(s): LABPROT, INR in the last 72 hours. CMP     Component Value Date/Time   NA 132 (L) 06/26/2016 0450   K 4.3 06/26/2016 0450   CL 96 (L) 06/26/2016 0450   CO2 27 06/26/2016 0450   GLUCOSE 94 06/26/2016 0450   BUN 9 06/26/2016 0450   CREATININE 0.70 06/26/2016 0450   CALCIUM 8.9 06/26/2016 0450   PROT 5.9 (L) 06/16/2016 1335   ALBUMIN 2.3 (L) 06/16/2016 1335   AST 43 (H) 06/16/2016 1335   ALT 31 06/16/2016 1335   ALKPHOS 386 (H) 06/16/2016 1335   BILITOT 0.6 06/16/2016 1335   GFRNONAA >60 06/26/2016 0450   GFRAA >60 06/26/2016 0450   Lipase  No results found for:  LIPASE     Studies/Results: No results found.  Anti-infectives: Anti-infectives    Start     Dose/Rate Route Frequency Ordered Stop   06/18/16 1300  amoxicillin-clavulanate (AUGMENTIN) 500-125 MG per tablet 500 mg     500 mg Oral 2 times daily 06/18/16 1240     06/18/16 0729  dextrose 5 % with cefOXitin (MEFOXIN) ADS Med    Comments:  [name]   : cabinet override      06/18/16 0729 06/18/16 1944   06/18/16 0702  ceFAZolin (ANCEF) 2-4 GM/100ML-% IVPB  Status:  Discontinued    Comments:  [name]   : cabinet override      06/18/16 0702 06/18/16 0735   06/18/16 0701  ceFAZolin (ANCEF) IVPB 2g/100 mL premix  Status:  Discontinued     2 g 200 mL/hr over 30 Minutes Intravenous On call to O.R. 06/18/16 0701 06/18/16 1238     Assessment/Plan POD#8 Port and diverting stoma - goal is home health with stoma care - thanks CM for arranging this. - continue to encourage ambulation  FEN: regular diet DVT Proph: Lovenox, SCD's ID: day#[id number] Augmentin  Dispo:discharge with total course of 14 days augmentin   LOS: 8 days    Jill Alexanders , [facility] 06/26/2016, 9:20 AM Pager:  [phone] Consults: [phone] Mon-Fri 7:00 am-4:30 pm Sat-Sun 7:00 am-11:30 am

## 2016-06-30 ENCOUNTER — Other Ambulatory Visit: Payer: Self-pay | Admitting: Nurse Practitioner

## 2016-06-30 ENCOUNTER — Ambulatory Visit: Payer: BLUE CROSS/BLUE SHIELD

## 2016-06-30 ENCOUNTER — Ambulatory Visit (HOSPITAL_COMMUNITY)
Admission: RE | Admit: 2016-06-30 | Discharge: 2016-06-30 | Disposition: A | Discharge status: Home / Self Care | Payer: BLUE CROSS/BLUE SHIELD | Source: Ambulatory Visit | Attending: Oncology | Admitting: Oncology

## 2016-06-30 ENCOUNTER — Encounter: Payer: Self-pay | Admitting: *Deleted

## 2016-06-30 ENCOUNTER — Ambulatory Visit (HOSPITAL_BASED_OUTPATIENT_CLINIC_OR_DEPARTMENT_OTHER)
Admission: RE | Admit: 2016-06-30 | Discharge: 2016-06-30 | Disposition: A | Discharge status: Home / Self Care | Payer: BLUE CROSS/BLUE SHIELD | Source: Ambulatory Visit | Attending: Oncology | Admitting: Oncology

## 2016-06-30 ENCOUNTER — Ambulatory Visit (HOSPITAL_BASED_OUTPATIENT_CLINIC_OR_DEPARTMENT_OTHER): Payer: BLUE CROSS/BLUE SHIELD | Admitting: Oncology

## 2016-06-30 ENCOUNTER — Telehealth: Payer: Self-pay | Admitting: *Deleted

## 2016-06-30 VITALS — BP 127/84 | HR 131 | Temp 98.9°F | Resp 20 | Ht 72.0 in | Wt 144.1 lb

## 2016-06-30 DIAGNOSIS — C779 Secondary and unspecified malignant neoplasm of lymph node, unspecified: Secondary | ICD-10-CM | POA: Insufficient documentation

## 2016-06-30 DIAGNOSIS — C786 Secondary malignant neoplasm of retroperitoneum and peritoneum: Secondary | ICD-10-CM

## 2016-06-30 DIAGNOSIS — J90 Pleural effusion, not elsewhere classified: Secondary | ICD-10-CM | POA: Diagnosis not present

## 2016-06-30 DIAGNOSIS — R918 Other nonspecific abnormal finding of lung field: Secondary | ICD-10-CM | POA: Insufficient documentation

## 2016-06-30 DIAGNOSIS — R600 Localized edema: Secondary | ICD-10-CM | POA: Insufficient documentation

## 2016-06-30 DIAGNOSIS — C189 Malignant neoplasm of colon, unspecified: Secondary | ICD-10-CM | POA: Insufficient documentation

## 2016-06-30 DIAGNOSIS — I82442 Acute embolism and thrombosis of left tibial vein: Secondary | ICD-10-CM | POA: Insufficient documentation

## 2016-06-30 DIAGNOSIS — C787 Secondary malignant neoplasm of liver and intrahepatic bile duct: Secondary | ICD-10-CM

## 2016-06-30 DIAGNOSIS — R000 Tachycardia, unspecified: Secondary | ICD-10-CM

## 2016-06-30 DIAGNOSIS — R599 Enlarged lymph nodes, unspecified: Secondary | ICD-10-CM

## 2016-06-30 DIAGNOSIS — G893 Neoplasm related pain (acute) (chronic): Secondary | ICD-10-CM

## 2016-06-30 DIAGNOSIS — C187 Malignant neoplasm of sigmoid colon: Secondary | ICD-10-CM | POA: Diagnosis not present

## 2016-06-30 DIAGNOSIS — M7989 Other specified soft tissue disorders: Secondary | ICD-10-CM

## 2016-06-30 DIAGNOSIS — D509 Iron deficiency anemia, unspecified: Secondary | ICD-10-CM

## 2016-06-30 DIAGNOSIS — R911 Solitary pulmonary nodule: Secondary | ICD-10-CM | POA: Diagnosis not present

## 2016-06-30 DIAGNOSIS — I82402 Acute embolism and thrombosis of unspecified deep veins of left lower extremity: Secondary | ICD-10-CM

## 2016-06-30 DIAGNOSIS — R188 Other ascites: Secondary | ICD-10-CM | POA: Diagnosis not present

## 2016-06-30 MED ORDER — ENOXAPARIN SODIUM 80 MG/0.8ML ~~LOC~~ SOLN
1.0000 mg/kg | Freq: Once | SUBCUTANEOUS | Status: AC
  Administered 2016-06-30: 65 mg via SUBCUTANEOUS
  Filled 2016-06-30: qty 0.8

## 2016-06-30 MED ORDER — RIVAROXABAN (XARELTO) VTE STARTER PACK (15 & 20 MG): ORAL_TABLET | ORAL | 0 refills | Status: AC

## 2016-06-30 MED ORDER — IOPAMIDOL (ISOVUE-370) INJECTION 76%
100.0000 mL | Freq: Once | INTRAVENOUS | Status: AC | PRN
  Administered 2016-06-30: 100 mL via INTRAVENOUS

## 2016-06-30 MED FILL — XARELTO STARTER PACK: 15 & 20 | 28 days supply | Qty: 51 | Fill #0

## 2016-06-30 NOTE — Progress Notes (Signed)
The left lower extremity venous doppler confirmed a DVT involving the mid to distal posterior tibial vein. I discussed this result with Andre Fernandez and his family. They understand the rationale for anticoagulation. [name] recommends Xarelto. We discussed the bleeding risk with anticoagulation. We also discussed that there is no reversal agent for Xarelto. Andre Fernandez is agreeable to proceed. I sent a prescription for the Xarelto starter pack to the [facility].  We contacted the [facility] to ensure he will be able to pick up the Xarelto and start tonight. Unfortunately they were unable to process the prescription due to missing insurance information. He would likely need to pay out of pocket to begin Xarelto tonight. I spoke to [name] and we decided to begin Lovenox 1 mg/kg subcutaneous. He received the first dose in the office this evening. He will return to the office tomorrow morning for an update on the Xarelto and possible Lovenox injection if we cannot obtain Xarelto.  Of note, the chest CT was negative for pulmonary embolus. He was noted to have bilateral pleural effusions. He denies shortness of breath. Oxygen saturation is 100% on room air.

## 2016-06-30 NOTE — Discharge Summary (Signed)
Physician Discharge Summary  Patient ID: Andre Fernandez MRN: [id number] DOB/AGE: June 09, 1956 60 y.o.  Admit date: 06/18/2016 Discharge date: 06/30/2016  Admission Diagnoses: Patient Active Problem List   Diagnosis Date Noted  . Colon cancer metastasized to multiple sites [facility]) 06/18/2016    Discharge Diagnoses:  Active Problems:   Colon cancer metastasized to multiple sites [facility])   Discharged Condition: stable  Hospital Course: Pt was admitted to the floor following port placement and lap diverting colostomy.  He had some swelling at his ostomy site and developed a post op ileus requiring an NGT.  This resolved, and he had good flatus/stool from ostomy.  He was able to ambulate.  He was able to void independently.  He transitioned from IV to oral analgesics/narcotics.  He continued to maintain a high white count preoperatively and post operatively.  No purulent material was found in his abdomen.  He was afebrile.  He had good understanding of ostomy care.  He is discharged to home with home health nursing and with his family.    Consults: WOCN  Significant Diagnostic Studies: labs: WBCs pre op 19.7, post op prior to d/c 17.8.  HCT 30.5, Cr 0.7.    Treatments: surgery: see above.    Discharge Exam: Blood pressure 105/79, pulse 96, temperature 98.1 F (36.7 C), temperature source Oral, resp. rate 16, height 6' (1.829 m), weight 64 kg (141 lb), SpO2 98 %. General appearance: alert, cooperative and no distress Resp: breathing comfortably Cardio: regular rate and rhythm GI: soft, non distended, non tender. ostomy pink.  stool/gas in bad.    Disposition: 01-Home or Self Care  Discharge Instructions    Elevated toilet seat    Complete by:  As directed   Walker rolling    Complete by:  As directed       Medication List    STOP taking these medications   metroNIDAZOLE 500 MG tablet Commonly known as:  FLAGYL   neomycin 500 MG tablet Commonly known as:  MYCIFRADIN     TAKE these  medications   acetaminophen 325 MG tablet Commonly known as:  TYLENOL Take 325-650 mg by mouth every 6 (six) hours as needed for fever.   amoxicillin-clavulanate 500-125 MG tablet Commonly known as:  AUGMENTIN Take 1 tablet (500 mg total) by mouth 2 (two) times daily.   bimatoprost 0.03 % ophthalmic solution Commonly known as:  LUMIGAN Place 1 drop into both eyes at bedtime.   bisoprolol-hydrochlorothiazide 10-6.25 MG tablet Commonly known as:  ZIAC Take 1 tablet by mouth every morning.   methocarbamol 500 MG tablet Commonly known as:  ROBAXIN Take 1 tablet (500 mg total) by mouth every 8 (eight) hours as needed for muscle spasms.   MIRALAX PO Take 17 g by mouth daily. Mix in 8 ounces of liquid   oxyCODONE-acetaminophen 5-325 MG tablet Commonly known as:  PERCOCET/ROXICET Take 1-2 tablets by mouth every 4 (four) hours as needed for severe pain. What changed:  how much to take   SENNA S 8.6-50 MG tablet Generic drug:  senna-docusate Take 1 tablet by mouth 2 (two) times daily. Can increase to #2 twice daily if needed or decrease to daily if needed      Follow-up Information    Aubreyana Saltz, MD. Schedule an appointment as soon as possible for a visit in 2 week(s).   Specialty:  General Surgery Why:  for post-operative follow up. Contact information: [address] [place] [address] [phone]  [place] .   Why:  home health nurse, rolling walker and 3n1 Contact information: [address] [place] [address] [phone]           Signed: Stark Klein 06/30/2016, 12:27 PM

## 2016-06-30 NOTE — Progress Notes (Signed)
  Andre Fernandez   Diagnosis: Rectal cancer  INTERVAL HISTORY:   He was taken to the operating room by [name] on 06/18/2016. A Port-A-Cath was placed. On abdominal exploration malignant ascites was noted with a significant burden of carcinomatosis. A diverting colostomy was performed. He returns today with multiple family members. The colostomy is functioning well. No pain. His activity level is limited. He ambulates in the home. No dyspnea or chest pain.  Objective:  Vital signs in last 24 hours:  Blood pressure 122/86, pulse (!) 131, temperature 97.7 F (36.5 C), temperature source Oral, resp. rate 19, height 6' (1.829 m), weight 144 lb 1.6 oz (65.4 kg), SpO2 100 %.    HEENT: The mucous membranes are moist Resp: Rhonchi versus rub at the left posterior lateral base, no respiratory distress Cardio: Tachycardia, regular rate and rhythm GI: Distended, no hepatosplenomegaly, left mid abdomen colostomy Vascular: 1+ edema at the left upper and lower leg    Portacath/PICC-without erythema  Lab Results:  Lab Results  Component Value Date   WBC 17.8 (H) 06/26/2016   HGB 9.9 (L) 06/26/2016   HCT 30.5 (L) 06/26/2016   MCV 78.2 06/26/2016   PLT 426 (H) 06/26/2016     Medications: I have reviewed the patient's current medications.  Assessment/Plan: 1. 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 CTs of the chest, abdomen, and pelvis on 06/07/2016 revealed mediastinal lymphadenopathy, lung nodules, liver metastases, a mass that appeared to be centered at the sigmoid colon with surrounding lymphadenopathy and peritoneal implants ? Surgery 06/18/2016 revealed carcinomatosis, status post diverting colostomy  2.   Microcytic anemia  3.   Pain secondary to #1  4.   Fever  5.   Tachycardia, no apparent explanation, referred for a stat chest CT  06/30/2016  6.    Left leg swelling-referred for a Doppler 06/30/2016  Disposition:  Andre Fernandez has metastatic rectal cancer. He underwent a diverting colostomy and Port-A-Cath placement on 06/18/2016. I discussed treatment options with Andre Fernandez and his family. He has a borderline performance status to receive chemotherapy and understands the increased risk of toxicity in patients with a poor performance status. He would like to proceed with a trial of chemotherapy.  I recommend FOLFOX chemotherapy. We reviewed the potential toxicities associated with the FOLFOX regimen including the chance for nausea/vomiting, mucositis, diarrhea, alopecia, and hematologic toxicity. We discussed the rash, sun sensitivity, hyperpigmentation, and hand/foot syndrome associated with 5-fluorouracil. We reviewed the allergic reaction and various types of neuropathy seen with oxaliplatin. He agrees to proceed. He will attend a chemotherapy teaching class today.  Andre Fernandez has tachycardia and a swollen left leg. He will be referred for a stat Doppler of the legs and a CT chest to rule out venous thromboembolic disease.  We will initiate anticoagulation therapy as indicated. He will return for an office visit and further discussion on 07/02/2016.  Approximately 40 minutes were spent with patient today. The majority of the time was used for counseling and coordination of care.  Betsy Coder, MD  06/30/2016  12:14 PM

## 2016-06-30 NOTE — Progress Notes (Signed)
VASCULAR LAB PRELIMINARY  PRELIMINARY  PRELIMINARY  PRELIMINARY  Bilateral lower extremity venous duplex complete per protocol when a DVT is found in the requested extremity.     Preliminary report:  Right:  No evidence of DVT, superficial thrombosis, or Baker's cyst. Left: DVT noted  In the mid to distal posterior tibial vein.  Also there is an area of soft sluggish flow behind a valve in the distal popliteal vein suggestive of a new forming thrombus  No evidence of superficial thrombosis.  No Baker's cyst.     Nayara Taplin, RVS 06/30/2016, 4:03 PM

## 2016-06-30 NOTE — Progress Notes (Signed)
Pt worked in for STAT chest CT and doppler.

## 2016-06-30 NOTE — Telephone Encounter (Signed)
Verbal order received and read back from Ned Card NP for Xarelto starter pack .  Order given to [facility] Pharmacist at this time.  Do not have group number on insurance card.  This nurse obtained card, faxed copy to pharmacy and obtained correct [place] number [phone].    Patient received Lovenox pending pharmacy trial for coverage.  Lovenox starter pack free and pharmacist obtained coupons for future orders for this patient.

## 2016-07-01 ENCOUNTER — Telehealth: Payer: Self-pay

## 2016-07-01 ENCOUNTER — Telehealth: Payer: Self-pay | Admitting: *Deleted

## 2016-07-01 ENCOUNTER — Encounter: Payer: Self-pay | Admitting: *Deleted

## 2016-07-01 NOTE — Telephone Encounter (Signed)
Called [place], CSW Ned Card, NP request Abby Potash contact pt/ pts daughter [name] regarding some insurance questions/ issues. Left message with Abby Potash.

## 2016-07-01 NOTE — Telephone Encounter (Signed)
Made daughter, [name] aware of appointment with [name] on 07/02/16 at 1:30 pm.

## 2016-07-01 NOTE — Progress Notes (Signed)
[place] Work  Clinical Social Work was referred by nurse for assessment of psychosocial needs due to possible insurance concerns.  Clinical Social Worker contacted patient's daughter as directed by RN in attempt to assist. CSW left brief message explaining the role of CSW and how CSW could assist. CSW also left financial advocate's contact information as they assist with financial/insurance issues. CSW will follow and assist accordingly.    Clinical Social Work interventions: Education  Loren Racer, [place] Clinical Social Worker [place]  [place] Phone: [phone] Fax: [phone]

## 2016-07-02 ENCOUNTER — Other Ambulatory Visit: Payer: Self-pay | Admitting: Nurse Practitioner

## 2016-07-02 ENCOUNTER — Encounter: Payer: Self-pay | Admitting: Oncology

## 2016-07-02 ENCOUNTER — Ambulatory Visit (HOSPITAL_BASED_OUTPATIENT_CLINIC_OR_DEPARTMENT_OTHER): Payer: BLUE CROSS/BLUE SHIELD | Admitting: Oncology

## 2016-07-02 ENCOUNTER — Other Ambulatory Visit: Payer: Self-pay | Admitting: *Deleted

## 2016-07-02 VITALS — BP 128/91 | HR 120 | Temp 98.3°F | Resp 17 | Ht 72.0 in | Wt 143.8 lb

## 2016-07-02 DIAGNOSIS — C189 Malignant neoplasm of colon, unspecified: Secondary | ICD-10-CM

## 2016-07-02 DIAGNOSIS — D509 Iron deficiency anemia, unspecified: Secondary | ICD-10-CM

## 2016-07-02 DIAGNOSIS — G893 Neoplasm related pain (acute) (chronic): Secondary | ICD-10-CM

## 2016-07-02 DIAGNOSIS — I82442 Acute embolism and thrombosis of left tibial vein: Secondary | ICD-10-CM

## 2016-07-02 DIAGNOSIS — C187 Malignant neoplasm of sigmoid colon: Secondary | ICD-10-CM | POA: Diagnosis not present

## 2016-07-02 DIAGNOSIS — C787 Secondary malignant neoplasm of liver and intrahepatic bile duct: Secondary | ICD-10-CM

## 2016-07-02 DIAGNOSIS — B370 Candidal stomatitis: Secondary | ICD-10-CM

## 2016-07-02 MED ORDER — FLUCONAZOLE 100 MG PO TABS: 100.0000 mg | ORAL_TABLET | Freq: Every day | ORAL | 0 refills | Status: DC

## 2016-07-02 MED ORDER — LIDOCAINE-PRILOCAINE 2.5-2.5 % EX CREA: TOPICAL_CREAM | CUTANEOUS | 0 refills | Status: AC

## 2016-07-02 MED ORDER — FLUCONAZOLE 100 MG PO TABS: 100.0000 mg | ORAL_TABLET | Freq: Every day | ORAL | 0 refills | Status: AC

## 2016-07-02 MED ORDER — PROCHLORPERAZINE MALEATE 10 MG PO TABS: 10.0000 mg | ORAL_TABLET | Freq: Four times a day (QID) | ORAL | 0 refills | Status: AC | PRN

## 2016-07-02 MED FILL — PROCHLORPERAZINE 10 MG TAB: 10 | 7 days supply | Qty: 30 | Fill #0

## 2016-07-02 MED FILL — LIDOCAINE-PRILOCAINE CREAM: 2.5-2.5 | 10 days supply | Qty: 30 | Fill #0

## 2016-07-02 NOTE — Progress Notes (Signed)
[facility] OFFICE PROGRESS NOTE   Diagnosis: Colon cancer  INTERVAL HISTORY:   Mr. Abila returns as scheduled. He is accompanied by multiple family members. He was diagnosed with a left lower extremity DVT on 06/30/2016. He began Xarelto. No bleeding. He reports improvement in dyspnea. No pain. He is eating, but his appetite is poor.  Objective:  Vital signs in last 24 hours:  Blood pressure (!) 128/91, pulse (!) 120, temperature 98.3 F (36.8 C), temperature source Oral, resp. rate 17, height 6' (1.829 m), weight 143 lb 12.8 oz (65.2 kg), SpO2 98 %.    HEENT: Mild buccal thrush, whitecoat over the tongue Resp: Lungs clear bilaterally, dullness to percussion at the lower posterior chest bilaterally, no respiratory distress Cardio: Regular rate and rhythm, tachycardia GI: No hepatosplenomegaly, left lower quadrant colostomy Vascular: Trace edema in the left lower leg  Portacath/PICC-without erythema  Lab Results:  Lab Results  Component Value Date   WBC 17.8 (H) 06/26/2016   HGB 9.9 (L) 06/26/2016   HCT 30.5 (L) 06/26/2016   MCV 78.2 06/26/2016   PLT 426 (H) 06/26/2016      Imaging:  Ct Angio Chest Pe W Or Wo Contrast  Result Date: 06/30/2016 CLINICAL DATA:  60 year old male with unexplained tachycardia. Metastatic colon cancer. Abnormal lung auscultation. Initial encounter. EXAM: CT ANGIOGRAPHY CHEST WITH CONTRAST TECHNIQUE: Multidetector CT imaging of the chest was performed using the standard protocol during bolus administration of intravenous contrast. Multiplanar CT image reconstructions and MIPs were obtained to evaluate the vascular anatomy. CONTRAST:  100 mL Isovue 370 COMPARISON:  Portable chest radiograph 06/18/2016 and earlier. Staging chest abdomen and pelvis CT 06/07/2016 FINDINGS: Good contrast bolus timing in the pulmonary arterial tree. No focal filling defect identified in the pulmonary arteries to suggest acute pulmonary embolism. Major  airways remain patent, but there are now moderate to large bilateral pleural effusions. The right is larger, and layering fluid on the right tracks into the apex. Associated compressive atelectasis in both lungs. No definite pleural based mass does identified amongst the pleural fluid. However, pulmonary metastatic disease does appear mildly progressed since July (new 10 mm pulmonary metastasis suspected in the left lower lobe on series 5, image 61) and includes nodularity along the major fissures in the aerated portions of both lungs. Also, there seems to be subtle soft tissue nodularity along the surface of the diaphragm on the right. Stable mediastinal lymphadenopathy since July. Bilateral hilar lymphadenopathy also appears stable. Some of these lymph nodes are calcified and could be postinflammatory rather than malignant. No axillary lymphadenopathy. Right peritracheal 13 mm probable metastatic node at the thoracic inlet is stable. No pericardial effusion.  Negative visualized aorta. Severe metastatic disease to the liver appears mildly progressed, including a dominant left lobe metastasis now measuring approximately 54 mm diameter versus 47 mm in July. There are contour deforming liver metastases. There is a large volume of ascites in the left upper quadrant. Malignant superior pericardial lymphadenopathy has progressed, with nodes individually up to 11 mm now (versus 8-9 mm previously). Negative visible spleen, adrenal glands, and renal upper poles. Mild scoliosis.  No destructive osseous lesion identified. Review of the MIP images confirms the above findings. IMPRESSION: 1.  No evidence of acute pulmonary embolus. 2. Progressed metastatic disease in the chest and upper abdomen since July, including significantly increased moderate to large bilateral pleural effusions and large volume left upper quadrant ascites. Electronically Signed   By: Genevie Ann M.D.   On: 06/30/2016  17:10    Medications: I have reviewed  the patient's current medications.  Assessment/Plan: 1. 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 CTs of the chest, abdomen, and pelvis on 06/07/2016 revealed mediastinal lymphadenopathy, lung nodules, liver metastases, a mass that appeared to be centered at the sigmoid colon with surrounding lymphadenopathy and peritoneal implants ? 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 CT chest 06/30/2016-moderate bilateral pleural effusions, ascites, progressive lung and liver metastases  2. Microcytic anemia  3. Pain secondary to #1  4. Fever  5.   Tachycardia, no apparent explanation, referred for a stat chest CT 06/30/2016-negative for pulmonary embolism  6.     left lower extremity DVT confirmed on Doppler 06/30/2016-Xarelto started 06/30/2016  7.   Oral candidiasis-treated with Diflucan 07/02/2016    Disposition:  Mr. Combee appears stable. He is being treated for a left lower extremity deep vein thrombosis. He has attended a chemotherapy teaching class.  The tachycardia is multifactorial. There was no evidence of pulmonary embolism on the chest CT 06/30/2016.  He will be treated for oral candidiasis. The plan is to begin FOLFOX chemotherapy 07/06/2016. He will return for an office visit and cycle 2 on 07/27/2016.  We discussed the importance of maintaining his nutrition. He has been given nutrition supplements. We will arrange for a social work evaluation to help with insurance concerns.  Betsy Coder, MD  07/02/2016  2:24 PM

## 2016-07-02 NOTE — Progress Notes (Signed)
Introduced myself as his FA.  Pt has concerns regarding ins because he can no longer pay the premiums so his ins will get canceled.  I gave him a Medicaid application as well as a financial application to apply for a discount thru the hospital which he's only eligible once he becomes uninsured.  Informed him Medicaid has a 90 day window to process applications and the hospital has a 2 week turn around time to process applications.  Also discussed the [place] and gave him an expense sheet.  He declined.  I also researched foundations for copay assistance but there aren't any foundations offering copay assistance for his Dx at this time.  Pt has my card for any questions or concerns he may have in the future.

## 2016-07-04 ENCOUNTER — Other Ambulatory Visit: Payer: Self-pay | Admitting: Oncology

## 2016-07-05 ENCOUNTER — Other Ambulatory Visit: Payer: Self-pay | Admitting: *Deleted

## 2016-07-05 ENCOUNTER — Telehealth: Payer: Self-pay | Admitting: *Deleted

## 2016-07-05 DIAGNOSIS — C189 Malignant neoplasm of colon, unspecified: Secondary | ICD-10-CM

## 2016-07-05 NOTE — Telephone Encounter (Signed)
  Oncology Nurse Navigator Documentation  Navigator Location: [facility] (07/05/16 1711) Navigator Encounter Type: Telephone (07/05/16 1711) Telephone: Incoming Call;Outgoing Call;Appt Confirmation/Clarification (07/05/16 1711)   Daughter left VM requesting to be called with the appointment time for chemo tomorrow. Says they have not heard from our office yet. Called back and left VM with appointment time for 0800 and to apply his EMLA cream and cover with plastic wrap at 0730 tomorrow. Suggested when they register to enter his current address in [place] and best phone # to call him. Navigator will see him tomorrow in tx area.

## 2016-07-06 ENCOUNTER — Encounter: Payer: Self-pay | Admitting: *Deleted

## 2016-07-06 ENCOUNTER — Ambulatory Visit: Payer: BLUE CROSS/BLUE SHIELD

## 2016-07-06 ENCOUNTER — Other Ambulatory Visit (HOSPITAL_BASED_OUTPATIENT_CLINIC_OR_DEPARTMENT_OTHER): Payer: BLUE CROSS/BLUE SHIELD

## 2016-07-06 ENCOUNTER — Telehealth: Payer: Self-pay | Admitting: Oncology

## 2016-07-06 ENCOUNTER — Ambulatory Visit (HOSPITAL_BASED_OUTPATIENT_CLINIC_OR_DEPARTMENT_OTHER): Payer: BLUE CROSS/BLUE SHIELD

## 2016-07-06 ENCOUNTER — Ambulatory Visit (HOSPITAL_COMMUNITY)
Admission: RE | Admit: 2016-07-06 | Discharge: 2016-07-06 | Disposition: A | Discharge status: Home / Self Care | Payer: BLUE CROSS/BLUE SHIELD | Source: Ambulatory Visit | Attending: Nurse Practitioner | Admitting: Nurse Practitioner

## 2016-07-06 ENCOUNTER — Other Ambulatory Visit: Payer: Self-pay | Admitting: *Deleted

## 2016-07-06 ENCOUNTER — Telehealth: Payer: Self-pay | Admitting: *Deleted

## 2016-07-06 ENCOUNTER — Ambulatory Visit (HOSPITAL_BASED_OUTPATIENT_CLINIC_OR_DEPARTMENT_OTHER): Payer: BLUE CROSS/BLUE SHIELD | Admitting: Nurse Practitioner

## 2016-07-06 VITALS — BP 110/92 | HR 118 | Temp 97.9°F | Resp 16

## 2016-07-06 DIAGNOSIS — R188 Other ascites: Secondary | ICD-10-CM | POA: Insufficient documentation

## 2016-07-06 DIAGNOSIS — C189 Malignant neoplasm of colon, unspecified: Secondary | ICD-10-CM

## 2016-07-06 DIAGNOSIS — C786 Secondary malignant neoplasm of retroperitoneum and peritoneum: Secondary | ICD-10-CM | POA: Diagnosis not present

## 2016-07-06 DIAGNOSIS — E46 Unspecified protein-calorie malnutrition: Secondary | ICD-10-CM

## 2016-07-06 DIAGNOSIS — C787 Secondary malignant neoplasm of liver and intrahepatic bile duct: Secondary | ICD-10-CM | POA: Diagnosis not present

## 2016-07-06 DIAGNOSIS — Z5111 Encounter for antineoplastic chemotherapy: Secondary | ICD-10-CM | POA: Diagnosis not present

## 2016-07-06 DIAGNOSIS — R530 Neoplastic (malignant) related fatigue: Secondary | ICD-10-CM

## 2016-07-06 DIAGNOSIS — R634 Abnormal weight loss: Secondary | ICD-10-CM

## 2016-07-06 LAB — CBC WITH DIFFERENTIAL/PLATELET
BASO%: 0.1 % (ref 0.0–2.0)
Basophils Absolute: 0 10*3/uL (ref 0.0–0.1)
EOS%: 1.9 % (ref 0.0–7.0)
Eosinophils Absolute: 0.5 10*3/uL (ref 0.0–0.5)
HEMATOCRIT: 31.7 % — AB (ref 38.4–49.9)
HEMOGLOBIN: 10.6 g/dL — AB (ref 13.0–17.1)
LYMPH#: 1.5 10*3/uL (ref 0.9–3.3)
LYMPH%: 5.2 % — ABNORMAL LOW (ref 14.0–49.0)
MCH: 26.2 pg — AB (ref 27.2–33.4)
MCHC: 33.4 g/dL (ref 32.0–36.0)
MCV: 78.5 fL — ABNORMAL LOW (ref 79.3–98.0)
MONO#: 1.6 10*3/uL — AB (ref 0.1–0.9)
MONO%: 5.6 % (ref 0.0–14.0)
NEUT%: 87.2 % — AB (ref 39.0–75.0)
NEUTROS ABS: 24.5 10*3/uL — AB (ref 1.5–6.5)
NRBC: 0 % (ref 0–0)
Platelets: 453 10*3/uL — ABNORMAL HIGH (ref 140–400)
RBC: 4.04 10*6/uL — ABNORMAL LOW (ref 4.20–5.82)
RDW: 19.2 % — AB (ref 11.0–14.6)
WBC: 28.1 10*3/uL — ABNORMAL HIGH (ref 4.0–10.3)

## 2016-07-06 LAB — COMPREHENSIVE METABOLIC PANEL
ALBUMIN: 1.8 g/dL — AB (ref 3.5–5.0)
ALT: 52 U/L (ref 0–55)
AST: 104 U/L — AB (ref 5–34)
Anion Gap: 14 mEq/L — ABNORMAL HIGH (ref 3–11)
BUN: 24.3 mg/dL (ref 7.0–26.0)
CALCIUM: 10.9 mg/dL — AB (ref 8.4–10.4)
CO2: 24 mEq/L (ref 22–29)
CREATININE: 0.8 mg/dL (ref 0.7–1.3)
Chloride: 100 mEq/L (ref 98–109)
EGFR: >90 mL/min/{1.73_m2} (ref >90)
GLUCOSE: 135 mg/dL (ref 70–140)
POTASSIUM: 4.3 meq/L (ref 3.5–5.1)
SODIUM: 137 meq/L (ref 136–145)
TOTAL PROTEIN: 6.3 g/dL — AB (ref 6.4–8.3)
Total Bilirubin: 1.47 mg/dL — ABNORMAL HIGH (ref 0.20–1.20)

## 2016-07-06 LAB — WHOLE BLOOD GLUCOSE: Glucose: 120 mg/dL — ABNORMAL HIGH (ref 70–100)

## 2016-07-06 LAB — TECHNOLOGIST REVIEW

## 2016-07-06 MED ORDER — DEXTROSE 5 % IV SOLN
Freq: Once | INTRAVENOUS | Status: AC
  Administered 2016-07-06: 15:00:00 via INTRAVENOUS

## 2016-07-06 MED ORDER — PALONOSETRON HCL INJECTION 0.25 MG/5ML
0.2500 mg | Freq: Once | INTRAVENOUS | Status: AC
  Administered 2016-07-06: 0.25 mg via INTRAVENOUS

## 2016-07-06 MED ORDER — FLUOROURACIL CHEMO INJECTION 2.5 GM/50ML: 400.0000 mg/m2 | Freq: Once | INTRAVENOUS | Status: DC

## 2016-07-06 MED ORDER — SODIUM CHLORIDE 0.9% FLUSH
10.0000 mL | INTRAVENOUS | Status: AC | PRN
  Administered 2016-07-06: 10 mL
  Filled 2016-07-06: qty 10

## 2016-07-06 MED ORDER — SODIUM CHLORIDE 0.9 % IV SOLN
10.0000 mg | Freq: Once | INTRAVENOUS | Status: AC
  Administered 2016-07-06: 10 mg via INTRAVENOUS
  Filled 2016-07-06: qty 1

## 2016-07-06 MED ORDER — PALONOSETRON HCL INJECTION 0.25 MG/5ML
INTRAVENOUS | Status: AC
  Filled 2016-07-06: qty 5

## 2016-07-06 MED ORDER — ZOLEDRONIC ACID 4 MG/100ML IV SOLN
4.0000 mg | Freq: Once | INTRAVENOUS | Status: AC
  Administered 2016-07-06: 4 mg via INTRAVENOUS
  Filled 2016-07-06: qty 100

## 2016-07-06 MED ORDER — SODIUM CHLORIDE 0.9 % IV SOLN
1800.0000 mg/m2 | INTRAVENOUS | Status: DC
  Administered 2016-07-06: 3300 mg via INTRAVENOUS
  Filled 2016-07-06: qty 66

## 2016-07-06 MED ORDER — LEUCOVORIN CALCIUM INJECTION 350 MG
400.0000 mg/m2 | Freq: Once | INTRAVENOUS | Status: AC
  Administered 2016-07-06: 728 mg via INTRAVENOUS
  Filled 2016-07-06: qty 36.4

## 2016-07-06 MED ORDER — HEPARIN SOD (PORK) LOCK FLUSH 100 UNIT/ML IV SOLN
500.0000 [IU] | INTRAVENOUS | Status: AC | PRN
  Administered 2016-07-06: 500 [IU]
  Filled 2016-07-06: qty 5

## 2016-07-06 MED ORDER — OXALIPLATIN CHEMO INJECTION 100 MG/20ML
65.0000 mg/m2 | Freq: Once | INTRAVENOUS | Status: AC
  Administered 2016-07-06: 120 mg via INTRAVENOUS
  Filled 2016-07-06: qty 14

## 2016-07-06 NOTE — Telephone Encounter (Signed)
Per symptom management APP move patients appt for today to Thursday. Need to draw labs, but don't need results to start treatment

## 2016-07-06 NOTE — Patient Instructions (Signed)
[facility] Discharge Instructions for Patients Receiving Chemotherapy  Today you received the following chemotherapy agents: Oxaliplatin, Leucovorn, and Adrucil.   To help prevent nausea and vomiting after your treatment, we encourage you to take your nausea medication as prescribed.   If you develop nausea and vomiting that is not controlled by your nausea medication, call the clinic.   BELOW ARE SYMPTOMS THAT SHOULD BE REPORTED IMMEDIATELY:  *FEVER GREATER THAN 100.5 F  *CHILLS WITH OR WITHOUT FEVER  NAUSEA AND VOMITING THAT IS NOT CONTROLLED WITH YOUR NAUSEA MEDICATION  *UNUSUAL SHORTNESS OF BREATH  *UNUSUAL BRUISING OR BLEEDING  TENDERNESS IN MOUTH AND THROAT WITH OR WITHOUT PRESENCE OF ULCERS  *URINARY PROBLEMS  *BOWEL PROBLEMS  UNUSUAL RASH Items with * indicate a potential emergency and should be followed up as soon as possible.  Feel free to call the clinic you have any questions or concerns. The clinic phone number is [phone].  Please show the [facility] at check-in to the Emergency Department and triage nurse.

## 2016-07-06 NOTE — Progress Notes (Signed)
1120- Patient arrived to appointment for his 1st FOLFOX treatment. Abnormal vital signs (see flowsheet). Numerous abnormal labs. Patient is diaphoretic, drowsy, and states that he has had chills. No fever noted today. Reported assessment findings to [name]. Selena Lesser saw patient in infusion area and Dr. [name] is seeing the patient as well. [name] states that chemotherapy will not be given today and other interventions will be discussed as we go forward. [name] assessing patient in infusion area currently.   1400- Patient returned to infusion area to be de-accessed after having paracentesis.  [place] Selena Lesser, NP and [name] saw the patient in infusion room again and decided to go ahead with chemotherapy for today. He will not get a 5FU bolus today.

## 2016-07-06 NOTE — Progress Notes (Signed)
Oncology Nurse Navigator Documentation  Oncology Nurse Navigator Flowsheets 07/06/2016  Navigator Location [facility]  Navigator Encounter Type Treatment  Telephone -  Abnormal Finding Date 06/03/2016  Confirmed Diagnosis Date 06/03/2016  Surgery Date 06/18/2016  Treatment Initiated Date 07/06/2016  Patient Visit Type MedOnc  Treatment Phase First Chemo Tx--FOLFOX  Barriers/Navigation Needs Financial;Education  Education Pain/ Symptom Management;Concerns with Insurance Coverage  Interventions Education Method;Other--notified financial advocate, Annamary Rummage that patient needs the Medicaid Application and Fatima S[place] application again (was lost)  Coordination of Care -  Education Method Verbal;Teach-back  Support Groups/Services -  Specialty Items/DME [employer] supplies--obtained phone # for [employer] since he was registered and gave to daughter to call about his supply order. They were waiting for the company to call them. Encouraged her to call today and make them aware his insurance may expire end of month and he can apply for indigent program.  Acuity Level 3  Time Spent with Patient 30  Andre Fernandez is unaware how to extend his insurance benefits or if he has short term disability benefits with his company. Encouraged his ex-wife and him to call company and speak with his HR representative. [name] expressed that they prefer to not go the Medicaid route, but nurse stressed that this is better than no insurance at all and encouraged them to begin the application process.  Encouraged him to try to increase his activity level, he will do better with the chemo if he is more active. He says his ostomy is having formed stool every day and they are doing OK with his ostomy care. They did not start the home health services--it was declined and this was confirmed with Adrian. He still appears weak and frail today. Is being seen by symptom management NP today prior to tx. Obtained   address to put in chart since he is staying with his daughter and ex-wife at this time.

## 2016-07-06 NOTE — Telephone Encounter (Signed)
CALLED PATIENT TO CONF APPTS PER 07/02/16 LOS. NO V/M AVAIL. APPT LTR AND SCHD MAILED. 07/06/16

## 2016-07-06 NOTE — Procedures (Signed)
Ultrasound-guided diagnostic and therapeutic paracentesis performed yielding 375 cc of slightly hazy, yellow fluid. No immediate complications. The fluid was sent to the lab for cytology. Only a small amount of ascites was present on [place] today , some of which was loculated in left abd region.

## 2016-07-06 NOTE — Progress Notes (Signed)
Chemo teaching, pump troubleshooting, side effect management reviewed with pt and S/O. Pt voiced understanding, ex-wife asked questions. Encouraged them to call office as needed.

## 2016-07-06 NOTE — Telephone Encounter (Signed)
lvm to inform pt of 9/6 appt date/times per 8/21 los

## 2016-07-07 ENCOUNTER — Encounter: Payer: Self-pay | Admitting: Nurse Practitioner

## 2016-07-07 ENCOUNTER — Emergency Department (HOSPITAL_COMMUNITY): Payer: BLUE CROSS/BLUE SHIELD

## 2016-07-07 ENCOUNTER — Telehealth: Payer: Self-pay | Admitting: *Deleted

## 2016-07-07 ENCOUNTER — Emergency Department (HOSPITAL_COMMUNITY)
Admission: EM | Admit: 2016-07-07 | Discharge: 2016-07-08 | Disposition: A | Discharge status: Home / Self Care | Payer: BLUE CROSS/BLUE SHIELD | Source: Home / Self Care | Attending: Emergency Medicine | Admitting: Emergency Medicine

## 2016-07-07 ENCOUNTER — Encounter (HOSPITAL_COMMUNITY): Payer: Self-pay

## 2016-07-07 DIAGNOSIS — R188 Other ascites: Secondary | ICD-10-CM | POA: Insufficient documentation

## 2016-07-07 DIAGNOSIS — Z79899 Other long term (current) drug therapy: Secondary | ICD-10-CM | POA: Insufficient documentation

## 2016-07-07 DIAGNOSIS — R1084 Generalized abdominal pain: Secondary | ICD-10-CM

## 2016-07-07 DIAGNOSIS — A419 Sepsis, unspecified organism: Secondary | ICD-10-CM | POA: Diagnosis not present

## 2016-07-07 DIAGNOSIS — Z87891 Personal history of nicotine dependence: Secondary | ICD-10-CM

## 2016-07-07 DIAGNOSIS — Z79891 Long term (current) use of opiate analgesic: Secondary | ICD-10-CM

## 2016-07-07 DIAGNOSIS — Z85038 Personal history of other malignant neoplasm of large intestine: Secondary | ICD-10-CM

## 2016-07-07 DIAGNOSIS — R7881 Bacteremia: Secondary | ICD-10-CM | POA: Diagnosis not present

## 2016-07-07 DIAGNOSIS — I10 Essential (primary) hypertension: Secondary | ICD-10-CM

## 2016-07-07 DIAGNOSIS — R112 Nausea with vomiting, unspecified: Secondary | ICD-10-CM

## 2016-07-07 LAB — URINALYSIS, ROUTINE W REFLEX MICROSCOPIC
Bilirubin Urine: NEGATIVE
Glucose, UA: NEGATIVE mg/dL
HGB URINE DIPSTICK: NEGATIVE
Ketones, ur: NEGATIVE mg/dL
Leukocytes, UA: NEGATIVE
Nitrite: NEGATIVE
Protein, ur: NEGATIVE mg/dL
SPECIFIC GRAVITY, URINE: 1.04 — AB (ref 1.005–1.030)
pH: 5 (ref 5.0–8.0)

## 2016-07-07 LAB — COMPREHENSIVE METABOLIC PANEL
ALBUMIN: 2.3 g/dL — AB (ref 3.5–5.0)
ALT: 47 U/L (ref 17–63)
AST: 99 U/L — AB (ref 15–41)
Alkaline Phosphatase: 1885 U/L — ABNORMAL HIGH (ref 38–126)
Anion gap: 14 (ref 5–15)
BUN: 47 mg/dL — AB (ref 6–20)
CHLORIDE: 98 mmol/L — AB (ref 101–111)
CO2: 24 mmol/L (ref 22–32)
Calcium: 10.3 mg/dL (ref 8.9–10.3)
Creatinine, Ser: 1.08 mg/dL (ref 0.61–1.24)
GFR calc Af Amer: >60 mL/min (ref >60)
GFR calc non Af Amer: >60 mL/min (ref >60)
GLUCOSE: 120 mg/dL — AB (ref 65–99)
POTASSIUM: 5.5 mmol/L — AB (ref 3.5–5.1)
SODIUM: 136 mmol/L (ref 135–145)
Total Bilirubin: 1.5 mg/dL — ABNORMAL HIGH (ref 0.3–1.2)
Total Protein: 6.7 g/dL (ref 6.5–8.1)

## 2016-07-07 LAB — CBC
HEMATOCRIT: 32.1 % — AB (ref 39.0–52.0)
Hemoglobin: 10.8 g/dL — ABNORMAL LOW (ref 13.0–17.0)
MCH: 26.5 pg (ref 26.0–34.0)
MCHC: 33.6 g/dL (ref 30.0–36.0)
MCV: 78.9 fL (ref 78.0–100.0)
Platelets: 395 10*3/uL (ref 150–400)
RBC: 4.07 MIL/uL — ABNORMAL LOW (ref 4.22–5.81)
RDW: 19.8 % — AB (ref 11.5–15.5)
WBC: 30.2 10*3/uL — AB (ref 4.0–10.5)

## 2016-07-07 LAB — TYPE AND SCREEN
ABO/RH(D): O POS
ANTIBODY SCREEN: NEGATIVE

## 2016-07-07 LAB — ABO/RH: ABO/RH(D): O POS

## 2016-07-07 LAB — OCCULT BLOOD GASTRIC / DUODENUM (SPECIMEN CUP)
Occult Blood, Gastric: NEGATIVE
pH, Gastric: 2

## 2016-07-07 MED ORDER — PROCHLORPERAZINE EDISYLATE 5 MG/ML IJ SOLN
10.0000 mg | Freq: Once | INTRAMUSCULAR | Status: AC
  Administered 2016-07-07: 10 mg via INTRAVENOUS
  Filled 2016-07-07: qty 2

## 2016-07-07 MED ORDER — PIPERACILLIN-TAZOBACTAM 3.375 G IVPB
3.3750 g | Freq: Once | INTRAVENOUS | Status: AC
  Administered 2016-07-07: 3.375 g via INTRAVENOUS
  Filled 2016-07-07: qty 50

## 2016-07-07 MED ORDER — SODIUM CHLORIDE 0.9 % IV SOLN
INTRAVENOUS | Status: DC
  Administered 2016-07-07: 23:00:00 via INTRAVENOUS

## 2016-07-07 MED ORDER — SODIUM CHLORIDE 0.9 % IV BOLUS (SEPSIS)
1000.0000 mL | Freq: Once | INTRAVENOUS | Status: AC
  Administered 2016-07-07: 1000 mL via INTRAVENOUS

## 2016-07-07 MED ORDER — IOPAMIDOL (ISOVUE-300) INJECTION 61%
100.0000 mL | Freq: Once | INTRAVENOUS | Status: AC | PRN
  Administered 2016-07-07: 100 mL via INTRAVENOUS

## 2016-07-07 MED ORDER — PANTOPRAZOLE SODIUM 40 MG IV SOLR
40.0000 mg | Freq: Once | INTRAVENOUS | Status: AC
  Administered 2016-07-07: 40 mg via INTRAVENOUS
  Filled 2016-07-07: qty 40

## 2016-07-07 NOTE — ED Notes (Signed)
Patient in CT

## 2016-07-07 NOTE — Progress Notes (Signed)
SYMPTOM MANAGEMENT CLINIC    Chief Complaint: hypercalcemia, ascites  HPI:  Andre Fernandez 60 y.o. male diagnosed with colon cancer.  Presents the cancer Center today to receive his first cycle of FOLFOX chemotherapy.    No history exists.    Review of Systems  Constitutional: Positive for malaise/fatigue and weight loss.  Gastrointestinal:       Abdomen firm but nontender.  All other systems reviewed and are negative.   Past Medical History:  Diagnosis Date  . Cancer ([place])    colon, liver mets per record from [facility]  . GERD (gastroesophageal reflux disease)    use to have reflux, reports that lately he hhas been regurgitating "foamy" substance   . Heart murmur    told that he has a "light murmur"   . Hypertension   . Kidney stones 1980's   both times treated with cystoscopy    Past Surgical History:  Procedure Laterality Date  . CYSTOSCOPY    . insertion of a  port a cath  06/18/2016  . LAPAROSCOPIC DIVERTED COLOSTOMY  06/18/2016  . LAPAROSCOPIC DIVERTED COLOSTOMY N/A 06/18/2016   Procedure: LAPAROSCOPIC DIVERTED COLOSTOMY;  Surgeon: Stark Klein, MD;  Location: [facility];  Service: General;  Laterality: N/A;  . PORTACATH PLACEMENT N/A 06/18/2016   Procedure: INSERTION PORT-A-CATH;  Surgeon: Stark Klein, MD;  Location: [place];  Service: General;  Laterality: N/A;    has Colon cancer metastasized to multiple sites [facility]); Hypercalcemia; and Ascites on his problem list.    is allergic to no known allergies.    Medication List       Accurate as of 07/06/16 11:59 PM. Always use your most recent med list.          acetaminophen 325 MG tablet Commonly known as:  TYLENOL Take 325-650 mg by mouth every 6 (six) hours as needed for fever.   bimatoprost 0.03 % ophthalmic solution Commonly known as:  LUMIGAN Place 1 drop into both eyes at bedtime.   bisoprolol-hydrochlorothiazide 10-6.25 MG tablet Commonly known as:  ZIAC Take 1 tablet by mouth every morning.     fluconazole 100 MG tablet Commonly known as:  DIFLUCAN Take 1 tablet (100 mg total) by mouth daily.   lidocaine-prilocaine cream Commonly known as:  EMLA Apply to port site one hour prior to use. Do not rub in, cover with plastic.   methocarbamol 500 MG tablet Commonly known as:  ROBAXIN Take 1 tablet (500 mg total) by mouth every 8 (eight) hours as needed for muscle spasms.   MIRALAX PO Take 17 g by mouth daily. Mix in 8 ounces of liquid   oxyCODONE-acetaminophen 5-325 MG tablet Commonly known as:  PERCOCET/ROXICET Take 1-2 tablets by mouth every 4 (four) hours as needed for severe pain.   prochlorperazine 10 MG tablet Commonly known as:  COMPAZINE Take 1 tablet (10 mg total) by mouth every 6 (six) hours as needed for nausea or vomiting.   Rivaroxaban 15 & 20 MG Tbpk Take as directed on package: Start with one 41m tablet by mouth twice a day with food. On Day 22, switch to one 24mtablet once a day with food.   SENNA S 8.6-50 MG tablet Generic drug:  senna-docusate Take 1 tablet by mouth 2 (two) times daily. Can increase to #2 twice daily if needed or decrease to daily if needed        PHYSICAL EXAMINATION  Oncology Vitals 07/06/2016 07/06/2016  Height - -  Weight - -  Weight (lbs) - -  BMI (kg/m2) - -  Temp - -  Pulse 118 117  Resp 16 -  SpO2 99 99  BSA (m2) - -   BP Readings from Last 2 Encounters:  07/06/16 115/86  07/06/16 (!) 110/92    Physical Exam  Constitutional: He is oriented to person, place, and time. He appears malnourished. He appears unhealthy. He appears cachectic.  HENT:  Head: Normocephalic and atraumatic.  Mouth/Throat: Oropharynx is clear and moist.  Eyes: Conjunctivae and EOM are normal. Pupils are equal, round, and reactive to light. Right eye exhibits no discharge. Left eye exhibits no discharge. No scleral icterus.  Neck: Normal range of motion. Neck supple. No JVD present. No tracheal deviation present. No thyromegaly present.   Cardiovascular: Normal rate, regular rhythm, normal heart sounds and intact distal pulses.   Pulmonary/Chest: Effort normal and breath sounds normal. No respiratory distress. He has no wheezes. He has no rales. He exhibits no tenderness.  Abdominal: Soft. Bowel sounds are normal. He exhibits no distension.  Bowel sounds positive 4 quadrants.  No tenderness with palpation.  Abdomen does appear firm.  Musculoskeletal: Normal range of motion. He exhibits no edema, tenderness or deformity.  Lymphadenopathy:    He has no cervical adenopathy.  Neurological: He is alert and oriented to person, place, and time. Gait normal.  Skin: Skin is warm and dry. No rash noted. No erythema. No pallor.  Patient was initially diaphoretic; but this resolved with later exams.  Psychiatric: Affect normal.  Nursing note and vitals reviewed.   LABORATORY DATA:. Appointment on 07/06/2016  Component Date Value Ref Range Status  . Glucose 07/06/2016 120* 70 - 100 mg/dL Final  . HRS PC 07/06/2016 not fasting  Hours Final  Appointment on 07/06/2016  Component Date Value Ref Range Status  . WBC 07/06/2016 28.1* 4.0 - 10.3 10e3/uL Final  . NEUT# 07/06/2016 24.5* 1.5 - 6.5 10e3/uL Final  . HGB 07/06/2016 10.6* 13.0 - 17.1 g/dL Final  . HCT 07/06/2016 31.7* 38.4 - 49.9 % Final  . Platelets 07/06/2016 453* 140 - 400 10e3/uL Final  . MCV 07/06/2016 78.5* 79.3 - 98.0 fL Final  . MCH 07/06/2016 26.2* 27.2 - 33.4 pg Final  . MCHC 07/06/2016 33.4  32.0 - 36.0 g/dL Final  . RBC 07/06/2016 4.04* 4.20 - 5.82 10e6/uL Final  . RDW 07/06/2016 19.2* 11.0 - 14.6 % Final  . lymph# 07/06/2016 1.5  0.9 - 3.3 10e3/uL Final  . MONO# 07/06/2016 1.6* 0.1 - 0.9 10e3/uL Final  . Eosinophils Absolute 07/06/2016 0.5  0.0 - 0.5 10e3/uL Final  . Basophils Absolute 07/06/2016 0.0  0.0 - 0.1 10e3/uL Final  . NEUT% 07/06/2016 87.2* 39.0 - 75.0 % Final  . LYMPH% 07/06/2016 5.2* 14.0 - 49.0 % Final  . MONO% 07/06/2016 5.6  0.0 - 14.0 % Final   . EOS% 07/06/2016 1.9  0.0 - 7.0 % Final  . BASO% 07/06/2016 0.1  0.0 - 2.0 % Final  . nRBC 07/06/2016 0  0 - 0 % Final  . Sodium 07/06/2016 137  136 - 145 mEq/L Final  . Potassium 07/06/2016 4.3  3.5 - 5.1 mEq/L Final  . Chloride 07/06/2016 100  98 - 109 mEq/L Final  . CO2 07/06/2016 24  22 - 29 mEq/L Final  . Glucose 07/06/2016 135  70 - 140 mg/dl Final  . BUN 07/06/2016 24.3  7.0 - 26.0 mg/dL Final  . Creatinine 07/06/2016 0.8  0.7 - 1.3 mg/dL Final  .  Total Bilirubin 07/06/2016 1.47* 0.20 - 1.20 mg/dL Final  . Alkaline Phosphatase 07/06/2016 2,718 Repeated and Verified* 40 - 150 U/L Final  . AST 07/06/2016 104* 5 - 34 U/L Final  . ALT 07/06/2016 52  0 - 55 U/L Final  . Total Protein 07/06/2016 6.3* 6.4 - 8.3 g/dL Final  . Albumin 07/06/2016 1.8* 3.5 - 5.0 g/dL Final  . Calcium 07/06/2016 10.9* 8.4 - 10.4 mg/dL Final  . Anion Gap 07/06/2016 14* 3 - 11 mEq/L Final  . EGFR 07/06/2016 >90  >90 ml/min/1.73 m2 Final  . Technologist Review 07/06/2016 few ovalo & targets. few large plt.   Final    RADIOGRAPHIC STUDIES: US Paracentesis  Result Date: 07/06/2016 INDICATION: Metastatic colon cancer, ascites. Request made for diagnostic and therapeutic paracentesis. EXAM: ULTRASOUND GUIDED DIAGNOSTIC AND THERAPEUTIC PARACENTESIS MEDICATIONS: None. COMPLICATIONS: None immediate. PROCEDURE: Informed written consent was obtained from the patient after a discussion of the risks, benefits and alternatives to treatment. A timeout was performed prior to the initiation of the procedure. Initial ultrasound scanning demonstrates a small amount of ascites within the right upper to mid abdominal quadrant. The right upper to mid abdomen was prepped and draped in the usual sterile fashion. 1% lidocaine was used for local anesthesia. Following this, a Yueh catheter was introduced. An ultrasound image was saved for documentation purposes. The paracentesis was performed. The catheter was removed and a dressing was  applied. The patient tolerated the procedure well without immediate post procedural complication. FINDINGS: A total of approximately 375 cc of slightly hazy, yellow fluid was removed. Samples were sent to the laboratory as requested by the clinical team. IMPRESSION: Successful ultrasound-guided diagnostic and therapeutic paracentesis yielding 375 cc of peritoneal fluid. Read by: Rowe Robert, PA-C Electronically Signed   By: Jerilynn Mages.  Shick M.D.   On: 07/06/2016 13:42    ASSESSMENT/PLAN:    Hypercalcemia Patient's Port-A-Cath in today was up to 12.  Most likely, this is secondary to patient's disease process.  Patient will receive Zometa 4 mg IV for treatment of hypercalcemia today.  We'll continue to monitor closely.  Colon cancer metastasized to multiple sites [facility]) Patient presented to the [facility] today to receive cycle one of his FOLFOX chemotherapy.  Initial exam revealed that patient was fatigued/weak, diaphoretic, and uncomfortable. Blood sugar checked was .  Labs obtained today revealed hypercalcemia corrected calcium of 12.66.  Also, white count was elevated to 28.1-most likely secondary to disease process.patient's alkaline phosphatase and AST was also elevated significantly.  Patient received Zometa infusion today; and also underwent a paracentesis as well.  Following both his Zometa in the paracentesis; decision was made for [name] for patient to proceed with his first cycle of chemotherapy.    Patient is scheduled to return on 07/08/2016 for follow-up visit and for pump discontinuation.  Ascites Patient's abdomen is firm.  Patient denies any abdominal discomfort; but does feel slightly bloated.  Bowel sounds positive.  No tenderness with palpation.  Paracentesis obtained today only obtained 375 ML's of fluid.  Fluid was sent for cytology and results are pending.     Patient stated understanding of all instructions; and was in agreement with this plan of care. The patient  knows to call the clinic with any problems, questions or concerns.   Total time spent with patient was 40 minutes;  with greater than 75 percent of that time spent in face to face counseling regarding patient's symptoms,  and coordination of care and follow up.  Disclaimer:This  dictation was prepared with Dragon/digital dictation along with [employer]. Any transcriptional errors that result from this process are unintentional.  Drue Second, NP 07/07/2016   This was a shared visit with Drue Second.  Mr. Calico was interviewed and examined.  He appears unchanged.  The elevated liver enzymes is likely related to the hepatic tumor burden.. We decided to proceed with dose reduced chemotherapy.  He will be seen for an office visit on 8/31.  The patient and family understand the increased risk for toxicity from chemotherapy in his case.  Julieanne Manson, MD

## 2016-07-07 NOTE — Telephone Encounter (Signed)
Spoke with pt's daughter [name]: She reports pt had one episode of vomiting with dark brown emesis this morning. "Looked like what they removed when he had the NG tube." Took compazine with relief of nausea. Encouraged Sasha to call office if pt has vomiting that does not respond to antiemetic. She reports pt stated he felt better after chemo.  Pt was asleep during call. Reviewed port site care, pump troubleshooting and side effect management instructions. Daughter voiced understanding. Next appt confirmed.

## 2016-07-07 NOTE — ED Notes (Signed)
MD at bedside. 

## 2016-07-07 NOTE — ED Triage Notes (Signed)
Pt has colon cancer and started treatment today, he has been vomiting dark brown today

## 2016-07-07 NOTE — Assessment & Plan Note (Signed)
Patient's abdomen is firm.  Patient denies any abdominal discomfort; but does feel slightly bloated.  Bowel sounds positive.  No tenderness with palpation.  Paracentesis obtained today only obtained 375 ML's of fluid.  Fluid was sent for cytology and results are pending.

## 2016-07-07 NOTE — Assessment & Plan Note (Signed)
Patient's Port-A-Cath in today was up to 12.  Most likely, this is secondary to patient's disease process.  Patient will receive Zometa 4 mg IV for treatment of hypercalcemia today.  We'll continue to monitor closely.

## 2016-07-07 NOTE — ED Notes (Signed)
Oxygen saturation @ 90% on RA.  Placed on 2L -oxygen saturation at 94%

## 2016-07-07 NOTE — Telephone Encounter (Signed)
Informed daughter that 1115 office visit appt has been changed to coincide with pump disconnect appt. Pt may not need office visit. She voiced understanding.

## 2016-07-07 NOTE — Telephone Encounter (Signed)
Keep appt. With [employer]

## 2016-07-07 NOTE — Assessment & Plan Note (Signed)
Patient presented to the [facility] today to receive cycle one of his FOLFOX chemotherapy.  Initial exam revealed that patient was fatigued/weak, diaphoretic, and uncomfortable. Blood sugar checked was .  Labs obtained today revealed hypercalcemia corrected calcium of 12.66.  Also, white count was elevated to 28.1-most likely secondary to disease process.patient's alkaline phosphatase and AST was also elevated significantly.  Patient received Zometa infusion today; and also underwent a paracentesis as well.  Following both his Zometa in the paracentesis; decision was made for [name] for patient to proceed with his first cycle of chemotherapy.    Patient is scheduled to return on 07/08/2016 for follow-up visit and for pump discontinuation.

## 2016-07-08 ENCOUNTER — Ambulatory Visit: Payer: BLUE CROSS/BLUE SHIELD

## 2016-07-08 ENCOUNTER — Ambulatory Visit (HOSPITAL_BASED_OUTPATIENT_CLINIC_OR_DEPARTMENT_OTHER): Payer: BLUE CROSS/BLUE SHIELD | Admitting: Nurse Practitioner

## 2016-07-08 ENCOUNTER — Ambulatory Visit (HOSPITAL_BASED_OUTPATIENT_CLINIC_OR_DEPARTMENT_OTHER): Payer: BLUE CROSS/BLUE SHIELD

## 2016-07-08 ENCOUNTER — Other Ambulatory Visit: Payer: BLUE CROSS/BLUE SHIELD

## 2016-07-08 VITALS — BP 100/75 | HR 112 | Temp 96.9°F | Resp 18

## 2016-07-08 VITALS — Wt 142.0 lb

## 2016-07-08 DIAGNOSIS — C7800 Secondary malignant neoplasm of unspecified lung: Secondary | ICD-10-CM

## 2016-07-08 DIAGNOSIS — C187 Malignant neoplasm of sigmoid colon: Secondary | ICD-10-CM | POA: Diagnosis not present

## 2016-07-08 DIAGNOSIS — G893 Neoplasm related pain (acute) (chronic): Secondary | ICD-10-CM | POA: Diagnosis not present

## 2016-07-08 DIAGNOSIS — C787 Secondary malignant neoplasm of liver and intrahepatic bile duct: Secondary | ICD-10-CM

## 2016-07-08 DIAGNOSIS — D649 Anemia, unspecified: Secondary | ICD-10-CM

## 2016-07-08 DIAGNOSIS — C189 Malignant neoplasm of colon, unspecified: Secondary | ICD-10-CM

## 2016-07-08 MED ORDER — SODIUM CHLORIDE 0.9% FLUSH
10.0000 mL | INTRAVENOUS | Status: DC | PRN
  Administered 2016-07-08: 10 mL
  Filled 2016-07-08: qty 10

## 2016-07-08 MED ORDER — HEPARIN SOD (PORK) LOCK FLUSH 100 UNIT/ML IV SOLN
500.0000 [IU] | Freq: Once | INTRAVENOUS | Status: AC | PRN
  Administered 2016-07-08: 500 [IU]
  Filled 2016-07-08: qty 5

## 2016-07-08 NOTE — Progress Notes (Signed)
Pt taken to collaborative side to be seen by Ned Card.  Huber needle left in place. Rosalio Macadamia, RN will pull needle if no further treatment is needed.

## 2016-07-08 NOTE — ED Provider Notes (Signed)
[facility] DEPT Provider Note   CSN: [id number] Arrival date & time: 07/07/16  1927     History   Chief Complaint Chief Complaint  Patient presents with  . Emesis  . Colon Cancer    HPI Kiari Denley is a 60 y.o. male.  The history is provided by the patient and a relative. No language interpreter was used.    Miriam Biondo is a 60 y.o. male who presents to the Emergency Department complaining of abdominal pain, vomiting.  He has a history of metastatic colon cancer and pus just started on chemotherapy yesterday on a home infusion through his port. He also had a diagnostic paracentesis performed yesterday. Today he developed abdominal discomfort and vomiting. He had 2 episodes of emesis prior to ED arrival, coffee-ground in color. He also reports intermittent abdominal discomfort. He has decreased output from his colostomy. No reports of fevers, chest pain, shortness breath.  Past Medical History:  Diagnosis Date  . Cancer ([place])    colon, liver mets per record from [place]  . GERD (gastroesophageal reflux disease)    use to have reflux, reports that lately he hhas been regurgitating "foamy" substance   . Heart murmur    told that he has a "light murmur"   . Hypertension   . Kidney stones 1980's   both times treated with cystoscopy    Patient Active Problem List   Diagnosis Date Noted  . Hypercalcemia 07/07/2016  . Ascites 07/07/2016  . Colon cancer metastasized to multiple sites [facility]) 06/18/2016    Past Surgical History:  Procedure Laterality Date  . CYSTOSCOPY    . insertion of a  port a cath  06/18/2016  . LAPAROSCOPIC DIVERTED COLOSTOMY  06/18/2016  . LAPAROSCOPIC DIVERTED COLOSTOMY N/A 06/18/2016   Procedure: LAPAROSCOPIC DIVERTED COLOSTOMY;  Surgeon: Stark Klein, MD;  Location: [facility];  Service: General;  Laterality: N/A;  . PORTACATH PLACEMENT N/A 06/18/2016   Procedure: INSERTION PORT-A-CATH;  Surgeon: Stark Klein, MD;  Location: [facility];  Service: General;   Laterality: N/A;       Home Medications    Prior to Admission medications   Medication Sig Start Date End Date Taking? Authorizing Provider  acetaminophen (TYLENOL) 325 MG tablet Take 325-650 mg by mouth every 6 (six) hours as needed for fever.   Yes Historical Provider, MD  bimatoprost (LUMIGAN) 0.03 % ophthalmic solution Place 1 drop into both eyes at bedtime.   Yes Historical Provider, MD  bisoprolol-hydrochlorothiazide (ZIAC) 10-6.25 MG tablet Take 1 tablet by mouth every evening.  05/27/16  Yes Historical Provider, MD  lidocaine-prilocaine (EMLA) cream Apply to port site one hour prior to use. Do not rub in, cover with plastic. Patient taking differently: Apply 1 application topically daily as needed (port access). Apply to port site one hour prior to use. Do not rub in, cover with plastic. 07/02/16  Yes Ladell Pier, MD  methocarbamol (ROBAXIN) 500 MG tablet Take 1 tablet (500 mg total) by mouth every 8 (eight) hours as needed for muscle spasms. 06/26/16  Yes Darci Current Simaan, PA-C  oxyCODONE-acetaminophen (PERCOCET/ROXICET) 5-325 MG tablet Take 1-2 tablets by mouth every 4 (four) hours as needed for severe pain. 06/26/16  Yes Darci Current Simaan, PA-C  Polyethylene Glycol 3350 (MIRALAX PO) Take 17 g by mouth daily. Mix in 8 ounces of liquid   Yes Ladell Pier, MD  prochlorperazine (COMPAZINE) 10 MG tablet Take 1 tablet (10 mg total) by mouth every 6 (six) hours as needed for  nausea or vomiting. 07/02/16  Yes Ladell Pier, MD  Rivaroxaban 15 & 20 MG TBPK Take as directed on package: Start with one 15mg  tablet by mouth twice a day with food. On Day 22, switch to one 20mg  tablet once a day with food. 06/30/16  Yes Owens Shark, NP  senna-docusate (SENNA S) 8.6-50 MG tablet Take 1-2 tablets by mouth 2 (two) times daily as needed for moderate constipation (take 1 tablet twice daily every day and if needed). take 1 tablet twice daily every day and if needed Can increase to #2 twice daily   Or can decrease to daily if needed   Yes Ladell Pier, MD  fluconazole (DIFLUCAN) 100 MG tablet Take 1 tablet (100 mg total) by mouth daily. Patient not taking: Reported on 07/07/2016 07/02/16   Owens Shark, NP    Family History History reviewed. No pertinent family history.  Social History Social History  Substance Use Topics  . Smoking status: Former Smoker    Types: Cigarettes    Quit date: 11/16/2011  . Smokeless tobacco: Never Used  . Alcohol use No     Comment: Quit 3 years ago     Allergies   Review of patient's allergies indicates no known allergies.   Review of Systems Review of Systems  All other systems reviewed and are negative.    Physical Exam Updated Vital Signs BP 123/88   Pulse 110   Temp 97.7 F (36.5 C) (Oral)   Resp (!) 30   SpO2 96%   Physical Exam  Constitutional: He is oriented to person, place, and time.  Chronically ill-appearing, cachectic  HENT:  Head: Normocephalic and atraumatic.  Cardiovascular: Regular rhythm.   No murmur heard. Tachycardic  Pulmonary/Chest: Effort normal and breath sounds normal. No respiratory distress.  Abdominal:  Firm abdomen with mild diffuse tenderness. Colostomy in the left lower quadrant with brown stool in bag. No guarding or rebound tenderness.  Musculoskeletal: He exhibits no edema or tenderness.  Neurological: He is alert and oriented to person, place, and time.  Skin: Skin is warm and dry.  Psychiatric: He has a normal mood and affect. His behavior is normal.  Nursing note and vitals reviewed.    ED Treatments / Results  Labs (all labs ordered are listed, but only abnormal results are displayed) Labs Reviewed  COMPREHENSIVE METABOLIC PANEL - Abnormal; Notable for the following:       Result Value   Potassium 5.5 (*)    Chloride 98 (*)    Glucose, Bld 120 (*)    BUN 47 (*)    Albumin 2.3 (*)    AST 99 (*)    Alkaline Phosphatase 1,885 (*)    Total Bilirubin 1.5 (*)    All other  components within normal limits  CBC - Abnormal; Notable for the following:    WBC 30.2 (*)    RBC 4.07 (*)    Hemoglobin 10.8 (*)    HCT 32.1 (*)    RDW 19.8 (*)    All other components within normal limits  URINALYSIS, ROUTINE W REFLEX MICROSCOPIC (NOT AT [facility]) - Abnormal; Notable for the following:    Color, Urine AMBER (*)    Specific Gravity, Urine 1.040 (*)    All other components within normal limits  CULTURE, BLOOD (ROUTINE X 2)  CULTURE, BLOOD (ROUTINE X 2)  OCCULT BLOOD GASTRIC / DUODENUM (SPECIMEN CUP)  PROTIME-INR  TYPE AND SCREEN  ABO/RH    EKG  EKG Interpretation None       Radiology Ct Abdomen Pelvis W Contrast  Result Date: 07/07/2016 CLINICAL DATA:  Colon cancer, beginning treatment today. Vomiting. Status post diverting colostomy on June 18, 2016 and paracentesis 1 day ago. EXAM: CT ABDOMEN AND PELVIS WITH CONTRAST TECHNIQUE: Multidetector CT imaging of the abdomen and pelvis was performed using the standard protocol following bolus administration of intravenous contrast. CONTRAST:  137mL ISOVUE-300 IOPAMIDOL (ISOVUE-300) INJECTION 61% COMPARISON:  CT abdomen and pelvis June 07, 2016 FINDINGS: LUNG BASES: Moderate bilateral pleural effusions, increased from prior CT. Multiple sub cm RIGHT middle lobe, lingular nodules/metastasis, 1 of which is new. Studding of the RIGHT hemidiaphragm. Heart size is normal. No pericardial effusion . SOLID ORGANS: Diffuse hepatic metastasis, RIGHT lower lobe mass was 7.7 x 6.4 cm, now 8 x 6.7 cm, mildly compressing the intrahepatic inferior vena cava. Metastasis in LEFT lobe of the liver was 4.9 x 4.3 cm, now 6.3 x 5.1 cm. Increasing splenic metastasis suspected. Adrenal glands are normal. Mild mass effect on the pancreas due to ascites, otherwise unremarkable. Gallbladder is decompressed. GASTROINTESTINAL TRACT: 6.8 x 8.3 cm mass in the pelvis is now 6.8 x 9.8 cm with central air density corresponding to patient's known primary  neoplasm. Surrounding lymphadenopathy, including 2.2 cm mesenteric lymph node with extensive surrounding fat stranding. Extensive studding of the mesenteries/omentum has increased from prior examination with matted appearance. Moderate amount of ascites with thickened enhancing peritoneal. A few bubbles of pneumoperitoneum in the RIGHT pericolic gutter and in the pelvis. New LEFT lower quadrant diverting colostomy. Gas distended small bowel to 4.3 cm. KIDNEYS/ URINARY TRACT: Kidneys are orthotopic, demonstrating symmetric enhancement. Nonobstructing punctate RIGHT lower pole nephrolithiasis. 3 mm LEFT interpolar nephrolithiasis without obstruction. No hydronephrosis or solid renal masses. The unopacified ureters are normal in course and caliber. Delayed imaging through the kidneys demonstrates symmetric prompt contrast excretion within the proximal urinary collecting system. Urinary bladder is partially distended and unremarkable. PERITONEUM/RETROPERITONEUM: Aortoiliac vessels are normal in course and caliber, mild calcific atherosclerosis. RIGHT peritoneal/diaphragmatic fluid collections have increased from prior examination, previously 7 x 14 mm, not 12 x 33 mm. SOFT TISSUE/OSSEOUS STRUCTURES: Non-suspicious. IMPRESSION: New moderate bilateral pleural effusions and at least 1 new though lung metastasis. Increasing size of hepatic metastasis consistent with disease progression. Enlarging necrotic colonic mass. Small amount of pneumoperitoneum, which may be secondary to yesterday's paracentesis or, perforated hollow viscus organ. Moderate amount of ascites, with omental caking and mesenteric lymphadenopathy consistent with disease progression. Fluid collection along the peritoneum and diaphragm most compatible with metastasis, less likely abscess. New diverting colostomy, with mild ileus. Acute findings discussed with and reconfirmed by [facility] Ellarae Nevitt on 07/07/2016 at 10:15 pm. Electronically Signed   By: Elon Alas M.D.   On: 07/07/2016 22:16   US Paracentesis  Result Date: 07/06/2016 INDICATION: Metastatic colon cancer, ascites. Request made for diagnostic and therapeutic paracentesis. EXAM: ULTRASOUND GUIDED DIAGNOSTIC AND THERAPEUTIC PARACENTESIS MEDICATIONS: None. COMPLICATIONS: None immediate. PROCEDURE: Informed written consent was obtained from the patient after a discussion of the risks, benefits and alternatives to treatment. A timeout was performed prior to the initiation of the procedure. Initial ultrasound scanning demonstrates a small amount of ascites within the right upper to mid abdominal quadrant. The right upper to mid abdomen was prepped and draped in the usual sterile fashion. 1% lidocaine was used for local anesthesia. Following this, a Yueh catheter was introduced. An ultrasound image was saved for documentation purposes. The paracentesis was performed. The catheter  was removed and a dressing was applied. The patient tolerated the procedure well without immediate post procedural complication. FINDINGS: A total of approximately 375 cc of slightly hazy, yellow fluid was removed. Samples were sent to the laboratory as requested by the clinical team. IMPRESSION: Successful ultrasound-guided diagnostic and therapeutic paracentesis yielding 375 cc of peritoneal fluid. Read by: Rowe Robert, PA-C Electronically Signed   By: Jerilynn Mages.  Shick M.D.   On: 07/06/2016 13:42   Dg Abd Acute W/chest  Result Date: 07/07/2016 CLINICAL DATA:  Colon cancer, vomiting EXAM: DG ABDOMEN ACUTE W/ 1V CHEST COMPARISON:  CT chest dated 06/30/2016 CT abdomen pelvis dated 06/07/2016. FINDINGS: Mild ill-defined right lower lung opacities, likely atelectasis. Left lower lobe opacity, likely atelectasis. Small pulmonary nodules/ metastases on recent CT are not radiographically evident. Moderate left and small right pleural effusions.  No pneumothorax. Cardiomegaly. Left chest port terminating at the cavoatrial junction.  Multiple mildly dilated loops of small bowel in the central abdomen. Ostomy in the left lower quadrant. No evidence of free air under the diaphragm on the upright view. Visualized osseous structures are within normal limits. IMPRESSION: Moderate left and small right pleural effusions. Associated bilateral lower lobe opacities, likely atelectasis. Known small pulmonary nodules/ metastases are not radiographically evident. Multiple mildly dilated small bowel in the central abdomen, nonspecific but worrisome for small bowel obstruction. Correlate with ostomy output. Electronically Signed   By: Julian Hy M.D.   On: 07/07/2016 21:27    Procedures Procedures (including critical care time)  Medications Ordered in ED Medications  sodium chloride 0.9 % bolus 1,000 mL (1,000 mLs Intravenous New Bag/Given 07/07/16 2205)    And  0.9 %  sodium chloride infusion ( Intravenous New Bag/Given 07/07/16 2310)  pantoprazole (PROTONIX) injection 40 mg (40 mg Intravenous Given 07/07/16 2205)  iopamidol (ISOVUE-300) 61 % injection 100 mL (100 mLs Intravenous Contrast Given 07/07/16 2150)  piperacillin-tazobactam (ZOSYN) IVPB 3.375 g (0 g Intravenous Stopped 07/07/16 2341)  prochlorperazine (COMPAZINE) injection 10 mg (10 mg Intravenous Given 07/07/16 2341)     Initial Impression / Assessment and Plan / ED Course  I have reviewed the triage vital signs and the nursing notes.  Pertinent labs & imaging results that were available during my care of the patient were reviewed by me and considered in my medical decision making (see chart for details).  Clinical Course    Patient here for evaluation of abdominal pain and vomiting. Emesis concerning for upper GI bleed based on the appearance but gastric occult was negative. CT scan demonstrates small volume pneumoperitoneum of unclear etiology. He did have instrumentation with paracentesis yesterday. He is mildly tender on examination but he is not peritoneal. CBC  demonstrates significant leukocytosis to 30,000, only mildly increased from yesterday at 28,000. He was empirically started on antibiotics given CT scan.  Discussed the case with Dr. Burr Medico with oncology patient's hx, CT findings, labs, and exam. Patient is requesting to go home and does not want admission to the hospital. Plan to DC home with close oncology follow-up in the office tomorrow as previously scheduled. Dr. Burr Medico feels leukocytosis secondary to his underlying cancer.  Favor pneumoperitoneum secondary to recent instrumentation over perforation.    Final Clinical Impressions(s) / ED Diagnoses   Final diagnoses:  Generalized abdominal pain  Non-intractable vomiting with nausea, vomiting of unspecified type    New Prescriptions New Prescriptions   No medications on file     Quintella Reichert, MD 07/08/16 0150

## 2016-07-08 NOTE — Progress Notes (Addendum)
[facility] OFFICE PROGRESS NOTE   Diagnosis:  Colon cancer  INTERVAL HISTORY:   Andre Fernandez returns as scheduled. He completed cycle 1 FOLFOX 07/06/2016. He was seen in the emergency department yesterday with abdominal pain and vomiting. The emesis was coffee-ground in color, negative for occult blood. CT scan showed progression of the cancer as well as a small amount of pneumoperitoneum felt to be secondary to the recent paracentesis. He was discharged home.  He denies abdominal pain. No further nausea/vomiting. He reports soft output from the colostomy. Appetite remains poor. He is tolerating fluids. His family reports a wound at the sacrum.  Objective:  Vital signs in last 24 hours: Tincher 96.9, heart rate 108, respirations 18, blood pressure 100/75 Weight 142 lb (64.4 kg).    HEENT: Mild white coating posterior tongue. Resp: Lungs clear bilaterally. No respiratory distress. Cardio: Regular, mild tachycardia. GI: Abdomen is distended. Left lower quadrant colostomy. Vascular: Trace to 1+ pitting edema left lower leg.  Skin: Sacral decubitus ulcer.  Port-A-Cath without erythema.  Lab Results:  Lab Results  Component Value Date   WBC 30.2 (H) 07/07/2016   HGB 10.8 (L) 07/07/2016   HCT 32.1 (L) 07/07/2016   MCV 78.9 07/07/2016   PLT 395 07/07/2016   NEUTROABS 24.5 (H) 07/06/2016    Imaging:  Ct Abdomen Pelvis W Contrast  Result Date: 07/07/2016 CLINICAL DATA:  Colon cancer, beginning treatment today. Vomiting. Status post diverting colostomy on June 18, 2016 and paracentesis 1 day ago. EXAM: CT ABDOMEN AND PELVIS WITH CONTRAST TECHNIQUE: Multidetector CT imaging of the abdomen and pelvis was performed using the standard protocol following bolus administration of intravenous contrast. CONTRAST:  170mL ISOVUE-300 IOPAMIDOL (ISOVUE-300) INJECTION 61% COMPARISON:  CT abdomen and pelvis June 07, 2016 FINDINGS: LUNG BASES: Moderate bilateral pleural effusions,  increased from prior CT. Multiple sub cm RIGHT middle lobe, lingular nodules/metastasis, 1 of which is new. Studding of the RIGHT hemidiaphragm. Heart size is normal. No pericardial effusion . SOLID ORGANS: Diffuse hepatic metastasis, RIGHT lower lobe mass was 7.7 x 6.4 cm, now 8 x 6.7 cm, mildly compressing the intrahepatic inferior vena cava. Metastasis in LEFT lobe of the liver was 4.9 x 4.3 cm, now 6.3 x 5.1 cm. Increasing splenic metastasis suspected. Adrenal glands are normal. Mild mass effect on the pancreas due to ascites, otherwise unremarkable. Gallbladder is decompressed. GASTROINTESTINAL TRACT: 6.8 x 8.3 cm mass in the pelvis is now 6.8 x 9.8 cm with central air density corresponding to patient's known primary neoplasm. Surrounding lymphadenopathy, including 2.2 cm mesenteric lymph node with extensive surrounding fat stranding. Extensive studding of the mesenteries/omentum has increased from prior examination with matted appearance. Moderate amount of ascites with thickened enhancing peritoneal. A few bubbles of pneumoperitoneum in the RIGHT pericolic gutter and in the pelvis. New LEFT lower quadrant diverting colostomy. Gas distended small bowel to 4.3 cm. KIDNEYS/ URINARY TRACT: Kidneys are orthotopic, demonstrating symmetric enhancement. Nonobstructing punctate RIGHT lower pole nephrolithiasis. 3 mm LEFT interpolar nephrolithiasis without obstruction. No hydronephrosis or solid renal masses. The unopacified ureters are normal in course and caliber. Delayed imaging through the kidneys demonstrates symmetric prompt contrast excretion within the proximal urinary collecting system. Urinary bladder is partially distended and unremarkable. PERITONEUM/RETROPERITONEUM: Aortoiliac vessels are normal in course and caliber, mild calcific atherosclerosis. RIGHT peritoneal/diaphragmatic fluid collections have increased from prior examination, previously 7 x 14 mm, not 12 x 33 mm. SOFT TISSUE/OSSEOUS STRUCTURES:  Non-suspicious. IMPRESSION: New moderate bilateral pleural effusions and at least 1  new though lung metastasis. Increasing size of hepatic metastasis consistent with disease progression. Enlarging necrotic colonic mass. Small amount of pneumoperitoneum, which may be secondary to yesterday's paracentesis or, perforated hollow viscus organ. Moderate amount of ascites, with omental caking and mesenteric lymphadenopathy consistent with disease progression. Fluid collection along the peritoneum and diaphragm most compatible with metastasis, less likely abscess. New diverting colostomy, with mild ileus. Acute findings discussed with and reconfirmed by [facility] on 07/07/2016 at 10:15 pm. Electronically Signed   By: Elon Alas M.D.   On: 07/07/2016 22:16   Dg Abd Acute W/chest  Result Date: 07/07/2016 CLINICAL DATA:  Colon cancer, vomiting EXAM: DG ABDOMEN ACUTE W/ 1V CHEST COMPARISON:  CT chest dated 06/30/2016 CT abdomen pelvis dated 06/07/2016. FINDINGS: Mild ill-defined right lower lung opacities, likely atelectasis. Left lower lobe opacity, likely atelectasis. Small pulmonary nodules/ metastases on recent CT are not radiographically evident. Moderate left and small right pleural effusions.  No pneumothorax. Cardiomegaly. Left chest port terminating at the cavoatrial junction. Multiple mildly dilated loops of small bowel in the central abdomen. Ostomy in the left lower quadrant. No evidence of free air under the diaphragm on the upright view. Visualized osseous structures are within normal limits. IMPRESSION: Moderate left and small right pleural effusions. Associated bilateral lower lobe opacities, likely atelectasis. Known small pulmonary nodules/ metastases are not radiographically evident. Multiple mildly dilated small bowel in the central abdomen, nonspecific but worrisome for small bowel obstruction. Correlate with ostomy output. Electronically Signed   By: Julian Hy M.D.   On:  07/07/2016 21:27    Medications: I have reviewed the patient's current medications.  Assessment/Plan: 1. 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 CTs of the chest, abdomen, and pelvis on 06/07/2016 revealed mediastinal lymphadenopathy, lung nodules, liver metastases, a mass that appeared to be centered at the sigmoid colon with surrounding lymphadenopathy and peritoneal implants ? 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 CT chest 06/30/2016-moderate bilateral pleural effusions, ascites, progressive lung and liver metastases ? Cycle 1 FOLFOX 07/06/2016 ? CT abdomen/pelvis done in the emergency department 07/07/2016 showed new moderate bilateral pleural effusions and at least one new lung metastasis. Increasing size of hepatic metastasis. Enlarging necrotic colonic mass. Small amount of pneumoperitoneum. Moderate amount of ascites with omental caking and mesenteric lymphadenopathy. Fluid collection along the peritoneum and diaphragm most compatible with metastasis.  2. Microcytic anemia  3. Pain secondary to #1  4. Fever  5. Tachycardia, no apparent explanation, referred for a stat chest CT 06/30/2016-negative for pulmonary embolism. Improved 07/08/2016.  6. Left lower extremity DVT confirmed on Doppler 06/30/2016-Xarelto started 06/30/2016  7.   Oral candidiasis-treated with Diflucan 07/02/2016  8.    Hypercalcemia 07/06/2016 status post Zometa  9.    Ascites status post ultrasound paracentesis 07/06/2016 with 375 mL of fluid removed. Cytology positive for adenocarcinoma   Disposition: Andre Fernandez has completed 1 cycle of FOLFOX. His performance status remains poor.  He has a sacral decubitus ulcer. We will try to arrange for evaluation by a skin care nurse. We discussed the importance of alleviating pressure in this area as well as  maximizing his nutrition.  He will return for a follow-up visit and repeat calcium level next week. He or his family will contact the office in the interim with further problems.  Patient seen with [name]. 25 minutes were spent face-to-face at today's visit with the majority of that time  involved in counseling/coordination of care.    Ned Card ANP/GNP-BC   07/08/2016  4:03 PM This was a shared visit with Ned Card. Andre Fernandez was interviewed and examined. He appears stable following chemotherapy yesterday. He will return for an office visit to follow-up on his clinical status and calcium level next week.  Julieanne Manson, M.D.

## 2016-07-09 ENCOUNTER — Encounter (HOSPITAL_COMMUNITY): Payer: Self-pay | Admitting: Emergency Medicine

## 2016-07-09 ENCOUNTER — Emergency Department (HOSPITAL_COMMUNITY): Payer: BLUE CROSS/BLUE SHIELD

## 2016-07-09 ENCOUNTER — Other Ambulatory Visit: Payer: Self-pay

## 2016-07-09 ENCOUNTER — Inpatient Hospital Stay (HOSPITAL_COMMUNITY)
Admission: EM | Admit: 2016-07-09 | Discharge: 2016-08-15 | DRG: 871 | Disposition: E | Payer: BLUE CROSS/BLUE SHIELD | Attending: Pulmonary Disease | Admitting: Pulmonary Disease

## 2016-07-09 ENCOUNTER — Telehealth (HOSPITAL_BASED_OUTPATIENT_CLINIC_OR_DEPARTMENT_OTHER): Payer: Self-pay | Admitting: *Deleted

## 2016-07-09 DIAGNOSIS — I824Z2 Acute embolism and thrombosis of unspecified deep veins of left distal lower extremity: Secondary | ICD-10-CM

## 2016-07-09 DIAGNOSIS — D61810 Antineoplastic chemotherapy induced pancytopenia: Secondary | ICD-10-CM

## 2016-07-09 DIAGNOSIS — T451X5A Adverse effect of antineoplastic and immunosuppressive drugs, initial encounter: Secondary | ICD-10-CM | POA: Diagnosis present

## 2016-07-09 DIAGNOSIS — Z681 Body mass index (BMI) 19 or less, adult: Secondary | ICD-10-CM | POA: Diagnosis not present

## 2016-07-09 DIAGNOSIS — D649 Anemia, unspecified: Secondary | ICD-10-CM | POA: Diagnosis present

## 2016-07-09 DIAGNOSIS — D6481 Anemia due to antineoplastic chemotherapy: Secondary | ICD-10-CM | POA: Diagnosis present

## 2016-07-09 DIAGNOSIS — N19 Unspecified kidney failure: Secondary | ICD-10-CM

## 2016-07-09 DIAGNOSIS — R52 Pain, unspecified: Secondary | ICD-10-CM

## 2016-07-09 DIAGNOSIS — Z933 Colostomy status: Secondary | ICD-10-CM

## 2016-07-09 DIAGNOSIS — R5081 Fever presenting with conditions classified elsewhere: Secondary | ICD-10-CM | POA: Diagnosis not present

## 2016-07-09 DIAGNOSIS — C787 Secondary malignant neoplasm of liver and intrahepatic bile duct: Secondary | ICD-10-CM | POA: Diagnosis present

## 2016-07-09 DIAGNOSIS — C7800 Secondary malignant neoplasm of unspecified lung: Secondary | ICD-10-CM | POA: Diagnosis present

## 2016-07-09 DIAGNOSIS — L89152 Pressure ulcer of sacral region, stage 2: Secondary | ICD-10-CM | POA: Diagnosis present

## 2016-07-09 DIAGNOSIS — C189 Malignant neoplasm of colon, unspecified: Secondary | ICD-10-CM | POA: Diagnosis present

## 2016-07-09 DIAGNOSIS — D6959 Other secondary thrombocytopenia: Secondary | ICD-10-CM | POA: Diagnosis present

## 2016-07-09 DIAGNOSIS — J690 Pneumonitis due to inhalation of food and vomit: Secondary | ICD-10-CM | POA: Diagnosis not present

## 2016-07-09 DIAGNOSIS — Z79899 Other long term (current) drug therapy: Secondary | ICD-10-CM

## 2016-07-09 DIAGNOSIS — R34 Anuria and oliguria: Secondary | ICD-10-CM | POA: Diagnosis not present

## 2016-07-09 DIAGNOSIS — Z515 Encounter for palliative care: Secondary | ICD-10-CM | POA: Diagnosis not present

## 2016-07-09 DIAGNOSIS — I82402 Acute embolism and thrombosis of unspecified deep veins of left lower extremity: Secondary | ICD-10-CM | POA: Diagnosis present

## 2016-07-09 DIAGNOSIS — G9341 Metabolic encephalopathy: Secondary | ICD-10-CM | POA: Diagnosis not present

## 2016-07-09 DIAGNOSIS — J9602 Acute respiratory failure with hypercapnia: Secondary | ICD-10-CM | POA: Diagnosis not present

## 2016-07-09 DIAGNOSIS — J9690 Respiratory failure, unspecified, unspecified whether with hypoxia or hypercapnia: Secondary | ICD-10-CM

## 2016-07-09 DIAGNOSIS — K5660 Unspecified intestinal obstruction: Secondary | ICD-10-CM | POA: Diagnosis present

## 2016-07-09 DIAGNOSIS — N179 Acute kidney failure, unspecified: Secondary | ICD-10-CM | POA: Diagnosis not present

## 2016-07-09 DIAGNOSIS — I10 Essential (primary) hypertension: Secondary | ICD-10-CM | POA: Diagnosis present

## 2016-07-09 DIAGNOSIS — L8990 Pressure ulcer of unspecified site, unspecified stage: Secondary | ICD-10-CM

## 2016-07-09 DIAGNOSIS — Z9889 Other specified postprocedural states: Secondary | ICD-10-CM | POA: Diagnosis not present

## 2016-07-09 DIAGNOSIS — R748 Abnormal levels of other serum enzymes: Secondary | ICD-10-CM | POA: Diagnosis present

## 2016-07-09 DIAGNOSIS — E43 Unspecified severe protein-calorie malnutrition: Secondary | ICD-10-CM | POA: Diagnosis not present

## 2016-07-09 DIAGNOSIS — R739 Hyperglycemia, unspecified: Secondary | ICD-10-CM | POA: Diagnosis not present

## 2016-07-09 DIAGNOSIS — J939 Pneumothorax, unspecified: Secondary | ICD-10-CM

## 2016-07-09 DIAGNOSIS — J95811 Postprocedural pneumothorax: Secondary | ICD-10-CM | POA: Diagnosis not present

## 2016-07-09 DIAGNOSIS — F419 Anxiety disorder, unspecified: Secondary | ICD-10-CM | POA: Diagnosis present

## 2016-07-09 DIAGNOSIS — R64 Cachexia: Secondary | ICD-10-CM | POA: Diagnosis present

## 2016-07-09 DIAGNOSIS — Z01818 Encounter for other preprocedural examination: Secondary | ICD-10-CM

## 2016-07-09 DIAGNOSIS — Y95 Nosocomial condition: Secondary | ICD-10-CM | POA: Diagnosis not present

## 2016-07-09 DIAGNOSIS — R7881 Bacteremia: Secondary | ICD-10-CM | POA: Diagnosis present

## 2016-07-09 DIAGNOSIS — J9601 Acute respiratory failure with hypoxia: Secondary | ICD-10-CM | POA: Diagnosis not present

## 2016-07-09 DIAGNOSIS — R188 Other ascites: Secondary | ICD-10-CM | POA: Diagnosis present

## 2016-07-09 DIAGNOSIS — R0602 Shortness of breath: Secondary | ICD-10-CM

## 2016-07-09 DIAGNOSIS — E872 Acidosis: Secondary | ICD-10-CM | POA: Diagnosis not present

## 2016-07-09 DIAGNOSIS — D509 Iron deficiency anemia, unspecified: Secondary | ICD-10-CM | POA: Diagnosis present

## 2016-07-09 DIAGNOSIS — A419 Sepsis, unspecified organism: Secondary | ICD-10-CM | POA: Diagnosis not present

## 2016-07-09 DIAGNOSIS — I82409 Acute embolism and thrombosis of unspecified deep veins of unspecified lower extremity: Secondary | ICD-10-CM | POA: Diagnosis present

## 2016-07-09 DIAGNOSIS — Z66 Do not resuscitate: Secondary | ICD-10-CM | POA: Diagnosis not present

## 2016-07-09 DIAGNOSIS — Z87442 Personal history of urinary calculi: Secondary | ICD-10-CM

## 2016-07-09 DIAGNOSIS — K668 Other specified disorders of peritoneum: Secondary | ICD-10-CM | POA: Diagnosis present

## 2016-07-09 DIAGNOSIS — Z87891 Personal history of nicotine dependence: Secondary | ICD-10-CM

## 2016-07-09 DIAGNOSIS — K219 Gastro-esophageal reflux disease without esophagitis: Secondary | ICD-10-CM | POA: Diagnosis present

## 2016-07-09 DIAGNOSIS — D72829 Elevated white blood cell count, unspecified: Secondary | ICD-10-CM

## 2016-07-09 DIAGNOSIS — B9689 Other specified bacterial agents as the cause of diseases classified elsewhere: Secondary | ICD-10-CM | POA: Diagnosis present

## 2016-07-09 DIAGNOSIS — C19 Malignant neoplasm of rectosigmoid junction: Secondary | ICD-10-CM | POA: Diagnosis present

## 2016-07-09 DIAGNOSIS — Z7189 Other specified counseling: Secondary | ICD-10-CM | POA: Diagnosis not present

## 2016-07-09 DIAGNOSIS — Z452 Encounter for adjustment and management of vascular access device: Secondary | ICD-10-CM

## 2016-07-09 DIAGNOSIS — K5900 Constipation, unspecified: Secondary | ICD-10-CM | POA: Diagnosis present

## 2016-07-09 DIAGNOSIS — C20 Malignant neoplasm of rectum: Secondary | ICD-10-CM | POA: Diagnosis not present

## 2016-07-09 DIAGNOSIS — J90 Pleural effusion, not elsewhere classified: Secondary | ICD-10-CM | POA: Diagnosis present

## 2016-07-09 DIAGNOSIS — E860 Dehydration: Secondary | ICD-10-CM | POA: Diagnosis present

## 2016-07-09 DIAGNOSIS — D638 Anemia in other chronic diseases classified elsewhere: Secondary | ICD-10-CM | POA: Diagnosis present

## 2016-07-09 DIAGNOSIS — E876 Hypokalemia: Secondary | ICD-10-CM | POA: Diagnosis present

## 2016-07-09 DIAGNOSIS — R6521 Severe sepsis with septic shock: Secondary | ICD-10-CM | POA: Diagnosis not present

## 2016-07-09 DIAGNOSIS — R4182 Altered mental status, unspecified: Secondary | ICD-10-CM | POA: Diagnosis not present

## 2016-07-09 LAB — I-STAT CG4 LACTIC ACID, ED: Lactic Acid, Venous: 2.74 mmol/L (ref 0.5–1.9)

## 2016-07-09 LAB — CBC WITH DIFFERENTIAL/PLATELET
BASOS ABS: 0 10*3/uL (ref 0.0–0.1)
Basophils Relative: 0 %
EOS PCT: 4 %
Eosinophils Absolute: 0.8 10*3/uL — ABNORMAL HIGH (ref 0.0–0.7)
HCT: 22.6 % — ABNORMAL LOW (ref 39.0–52.0)
HEMOGLOBIN: 7.7 g/dL — AB (ref 13.0–17.0)
LYMPHS ABS: 0.2 10*3/uL — AB (ref 0.7–4.0)
Lymphocytes Relative: 1 %
MCH: 26.6 pg (ref 26.0–34.0)
MCHC: 34.1 g/dL (ref 30.0–36.0)
MCV: 77.9 fL — ABNORMAL LOW (ref 78.0–100.0)
MONO ABS: 0.2 10*3/uL (ref 0.1–1.0)
Monocytes Relative: 1 %
NEUTROS PCT: 94 %
Neutro Abs: 19.2 10*3/uL — ABNORMAL HIGH (ref 1.7–7.7)
PLATELETS: 240 10*3/uL (ref 150–400)
RBC: 2.9 MIL/uL — AB (ref 4.22–5.81)
RDW: 19.8 % — AB (ref 11.5–15.5)
WBC: 20.4 10*3/uL — AB (ref 4.0–10.5)

## 2016-07-09 LAB — COMPREHENSIVE METABOLIC PANEL
ALBUMIN: 1.7 g/dL — AB (ref 3.5–5.0)
ALT: 35 U/L (ref 17–63)
ANION GAP: 9 (ref 5–15)
AST: 53 U/L — AB (ref 15–41)
Alkaline Phosphatase: 1243 U/L — ABNORMAL HIGH (ref 38–126)
BUN: 48 mg/dL — ABNORMAL HIGH (ref 6–20)
CHLORIDE: 108 mmol/L (ref 101–111)
CO2: 23 mmol/L (ref 22–32)
Calcium: 7.5 mg/dL — ABNORMAL LOW (ref 8.9–10.3)
Creatinine, Ser: 0.92 mg/dL (ref 0.61–1.24)
GFR calc Af Amer: >60 mL/min (ref >60)
GFR calc non Af Amer: >60 mL/min (ref >60)
GLUCOSE: 105 mg/dL — AB (ref 65–99)
POTASSIUM: 3.1 mmol/L — AB (ref 3.5–5.1)
SODIUM: 140 mmol/L (ref 135–145)
Total Bilirubin: 1 mg/dL (ref 0.3–1.2)
Total Protein: 5 g/dL — ABNORMAL LOW (ref 6.5–8.1)

## 2016-07-09 LAB — URINALYSIS, ROUTINE W REFLEX MICROSCOPIC
Bilirubin Urine: NEGATIVE
GLUCOSE, UA: NEGATIVE mg/dL
Hgb urine dipstick: NEGATIVE
Ketones, ur: NEGATIVE mg/dL
LEUKOCYTES UA: NEGATIVE
Nitrite: NEGATIVE
PH: 5 (ref 5.0–8.0)
PROTEIN: NEGATIVE mg/dL
SPECIFIC GRAVITY, URINE: 1.028 (ref 1.005–1.030)

## 2016-07-09 LAB — BLOOD CULTURE ID PANEL (REFLEXED)
Acinetobacter baumannii: NOT DETECTED
CANDIDA ALBICANS: NOT DETECTED
CANDIDA KRUSEI: NOT DETECTED
CANDIDA PARAPSILOSIS: NOT DETECTED
CANDIDA TROPICALIS: NOT DETECTED
CARBAPENEM RESISTANCE: NOT DETECTED
Candida glabrata: NOT DETECTED
ENTEROCOCCUS SPECIES: NOT DETECTED
Enterobacter cloacae complex: NOT DETECTED
Enterobacteriaceae species: NOT DETECTED
Escherichia coli: NOT DETECTED
Haemophilus influenzae: NOT DETECTED
KLEBSIELLA OXYTOCA: NOT DETECTED
KLEBSIELLA PNEUMONIAE: NOT DETECTED
Listeria monocytogenes: NOT DETECTED
Methicillin resistance: NOT DETECTED
Neisseria meningitidis: NOT DETECTED
PROTEUS SPECIES: NOT DETECTED
Pseudomonas aeruginosa: NOT DETECTED
STAPHYLOCOCCUS AUREUS BCID: NOT DETECTED
STAPHYLOCOCCUS SPECIES: NOT DETECTED
STREPTOCOCCUS PNEUMONIAE: NOT DETECTED
Serratia marcescens: NOT DETECTED
Streptococcus agalactiae: NOT DETECTED
Streptococcus pyogenes: NOT DETECTED
Streptococcus species: NOT DETECTED
VANCOMYCIN RESISTANCE: NOT DETECTED

## 2016-07-09 LAB — PROTIME-INR
INR: 4.53 — AB
Prothrombin Time: 44.2 seconds — ABNORMAL HIGH (ref 11.4–15.2)

## 2016-07-09 LAB — MAGNESIUM: Magnesium: 2.3 mg/dL (ref 1.7–2.4)

## 2016-07-09 LAB — POC OCCULT BLOOD, ED: Fecal Occult Bld: POSITIVE — AB

## 2016-07-09 MED ORDER — METHOCARBAMOL 500 MG PO TABS
500.0000 mg | ORAL_TABLET | Freq: Three times a day (TID) | ORAL | Status: DC | PRN
Start: 2016-07-09 — End: 2016-07-14

## 2016-07-09 MED ORDER — BISACODYL 5 MG PO TBEC
5.0000 mg | DELAYED_RELEASE_TABLET | Freq: Every day | ORAL | Status: DC | PRN
  Filled 2016-07-09: qty 1

## 2016-07-09 MED ORDER — LATANOPROST 0.005 % OP SOLN
1.0000 [drp] | Freq: Every day | OPHTHALMIC | Status: DC
  Administered 2016-07-09 – 2016-07-16 (×7): 1 [drp] via OPHTHALMIC
  Filled 2016-07-09 (×2): qty 2.5

## 2016-07-09 MED ORDER — SODIUM CHLORIDE 0.9 % IV BOLUS (SEPSIS)
1000.0000 mL | Freq: Once | INTRAVENOUS | Status: AC
Start: 2016-07-09 — End: 2016-07-09
  Administered 2016-07-09: 1000 mL via INTRAVENOUS

## 2016-07-09 MED ORDER — ONDANSETRON HCL 4 MG/2ML IJ SOLN
4.0000 mg | Freq: Four times a day (QID) | INTRAMUSCULAR | Status: DC | PRN
  Administered 2016-07-10 – 2016-07-11 (×3): 4 mg via INTRAVENOUS
  Filled 2016-07-09 (×3): qty 2

## 2016-07-09 MED ORDER — ONDANSETRON HCL 4 MG PO TABS: 4.0000 mg | ORAL_TABLET | Freq: Four times a day (QID) | ORAL | Status: DC | PRN

## 2016-07-09 MED ORDER — DEXTROSE 5 % IV SOLN
2.0000 g | INTRAVENOUS | Status: AC
  Administered 2016-07-09: 2 g via INTRAVENOUS
  Filled 2016-07-09: qty 2

## 2016-07-09 MED ORDER — VANCOMYCIN HCL IN DEXTROSE 1-5 GM/200ML-% IV SOLN
1000.0000 mg | INTRAVENOUS | Status: AC
  Administered 2016-07-09: 1000 mg via INTRAVENOUS
  Filled 2016-07-09: qty 200

## 2016-07-09 MED ORDER — SODIUM CHLORIDE 0.9% FLUSH
3.0000 mL | Freq: Two times a day (BID) | INTRAVENOUS | Status: DC
  Administered 2016-07-12 – 2016-07-17 (×8): 3 mL via INTRAVENOUS

## 2016-07-09 MED ORDER — RIVAROXABAN 20 MG PO TABS: 20.0000 mg | ORAL_TABLET | Freq: Every day | ORAL | Status: DC

## 2016-07-09 MED ORDER — DEXTROSE-NACL 5-0.45 % IV SOLN
INTRAVENOUS | Status: DC
  Administered 2016-07-09 – 2016-07-15 (×12): via INTRAVENOUS

## 2016-07-09 MED ORDER — SODIUM CHLORIDE 0.9 % IV BOLUS (SEPSIS)
1000.0000 mL | Freq: Once | INTRAVENOUS | Status: AC
  Administered 2016-07-09: 1000 mL via INTRAVENOUS

## 2016-07-09 MED ORDER — OXYCODONE-ACETAMINOPHEN 5-325 MG PO TABS
1.0000 | ORAL_TABLET | ORAL | Status: DC | PRN
  Administered 2016-07-09 – 2016-07-12 (×8): 1 via ORAL
  Administered 2016-07-12 – 2016-07-13 (×2): 2 via ORAL
  Filled 2016-07-09 (×2): qty 2
  Filled 2016-07-09: qty 1
  Filled 2016-07-09: qty 2
  Filled 2016-07-09 (×6): qty 1

## 2016-07-09 MED ORDER — VANCOMYCIN HCL IN DEXTROSE 750-5 MG/150ML-% IV SOLN
750.0000 mg | Freq: Two times a day (BID) | INTRAVENOUS | Status: DC
  Administered 2016-07-09 – 2016-07-11 (×4): 750 mg via INTRAVENOUS
  Filled 2016-07-09 (×5): qty 150

## 2016-07-09 MED ORDER — ENSURE ENLIVE PO LIQD
237.0000 mL | Freq: Three times a day (TID) | ORAL | Status: DC
  Administered 2016-07-09 – 2016-07-13 (×11): 237 mL via ORAL

## 2016-07-09 MED ORDER — POTASSIUM CHLORIDE 10 MEQ/100ML IV SOLN
10.0000 meq | INTRAVENOUS | Status: AC
  Administered 2016-07-09 (×4): 10 meq via INTRAVENOUS
  Filled 2016-07-09 (×2): qty 100

## 2016-07-09 MED ORDER — ONDANSETRON HCL 4 MG/2ML IJ SOLN
4.0000 mg | Freq: Once | INTRAMUSCULAR | Status: AC
  Administered 2016-07-09: 4 mg via INTRAVENOUS
  Filled 2016-07-09: qty 2

## 2016-07-09 MED ORDER — LIDOCAINE-PRILOCAINE 2.5-2.5 % EX CREA
1.0000 "application " | TOPICAL_CREAM | Freq: Every day | CUTANEOUS | Status: DC | PRN
  Filled 2016-07-09: qty 5

## 2016-07-09 MED ORDER — DEXTROSE 5 % IV SOLN
1.0000 g | Freq: Three times a day (TID) | INTRAVENOUS | Status: DC
  Administered 2016-07-09 – 2016-07-11 (×6): 1 g via INTRAVENOUS
  Filled 2016-07-09 (×6): qty 1

## 2016-07-09 MED ORDER — PROCHLORPERAZINE MALEATE 10 MG PO TABS
10.0000 mg | ORAL_TABLET | Freq: Four times a day (QID) | ORAL | Status: DC | PRN
  Administered 2016-07-09 – 2016-07-12 (×3): 10 mg via ORAL
  Filled 2016-07-09 (×3): qty 1

## 2016-07-09 MED ORDER — RIVAROXABAN 15 MG PO TABS
15.0000 mg | ORAL_TABLET | Freq: Two times a day (BID) | ORAL | Status: DC
  Administered 2016-07-09 – 2016-07-13 (×10): 15 mg via ORAL
  Filled 2016-07-09 (×10): qty 1

## 2016-07-09 MED ORDER — SENNOSIDES-DOCUSATE SODIUM 8.6-50 MG PO TABS
1.0000 | ORAL_TABLET | Freq: Two times a day (BID) | ORAL | Status: DC | PRN
  Administered 2016-07-10 – 2016-07-12 (×4): 1 via ORAL
  Filled 2016-07-09 (×4): qty 1

## 2016-07-09 NOTE — ED Triage Notes (Signed)
Pt states he was called this morning and was told to come to the emergency room because his WBC was elevated  Pt states he had labwork drawn yesterday at his dr office  Pt states he started chemo on Tuesday  Pt also reports he was seen here on the 23rd for vomiting  Pt has colon cancer and is treated by Dr Benay Spice

## 2016-07-09 NOTE — Progress Notes (Signed)
Initial Nutrition Assessment  DOCUMENTATION CODES:   Severe malnutrition in context of acute illness/injury  INTERVENTION:   -Provide Ensure Enlive po TID, each supplement provides 350 kcal and 20 grams of protein -Placed lunch order for patient -Recommend speech therapy consult if swallowing difficulties persist -Reviewed strategies to help with taste changes -Encourage PO intake -RD to continue to monitor  NUTRITION DIAGNOSIS:   Malnutrition related to acute illness as evidenced by severe depletion of body fat, severe depletion of muscle mass.  GOAL:   Patient will meet greater than or equal to 90% of their needs  MONITOR:   PO intake, Supplement acceptance, Labs, Weight trends, I & O's  REASON FOR ASSESSMENT:   Consult Assessment of nutrition requirement/status  ASSESSMENT:   60 y.o. 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 [name] from oncology and has started chemotherapy with round 1 which was about 2 days ago. He presented to the emergency room yesterday due to intractable nausea and vomiting, and improvement in the ED and was sent home.  Patient in room with family at bedside. Family reports pt had an appointment to see the [facility] this week. Pt has had N/V following his last chemo treatment. Pt reports swallowing difficulty which has started over the last 2-3 days. States it is harder to drink liquids and take his medications. Family reports pt has been treated for thrush as well. Reviewed mouth rinsing practices which can help. Pt likes ensure supplements and is agreeable to receiving supplements between meals. Pt states he is not hungry at all. His family encourages him and gets him to eat at least once a day, maybe twice. He does not want to eat today but agreed to  have RD order him some soup for lunch.  Patient's weight has remained stable ~140 lb. Nutrition-Focused physical exam completed. Findings are severe fat depletion, severe muscle depletion, and no edema.   Medications: D5 and .45% NaCl infusion at 75 ml/hr -provides 306 kcal Labs reviewed: Low K  Diet Order:  Diet Heart Room service appropriate? Yes; Fluid consistency: Thin  Skin:  Reviewed, no issues  Last BM:  PTA  Height:   Ht Readings from Last 1 Encounters:  07/07/2016 6\' 1"  (1.854 m)    Weight:   Wt Readings from Last 1 Encounters:  06/28/2016 145 lb (65.8 kg)    Ideal Body Weight:  83.6 kg  BMI:  Body mass index is 19.13 kg/m.  Estimated Nutritional Needs:   Kcal:  2000-2200  Protein:  100-110g  Fluid:  2.2L/day  EDUCATION NEEDS:   Education needs addressed  Clayton Bibles, MS, RD, LDN Pager: [phone] After Hours Pager: [phone]

## 2016-07-09 NOTE — Progress Notes (Signed)
ANTICOAGULATION CONSULT NOTE - Initial Consult  Pharmacy Consult for Xarelto Indication: Hx DVT  No Known Allergies  Patient Measurements: Height: 6\' 1"  (185.4 cm) Weight: 145 lb (65.8 kg) IBW/kg (Calculated) : 79.9  Vital Signs: Temp: 97.5 F (36.4 C) (08/25 0632) Temp Source: Oral (08/25 [id number]) BP: 103/77 (08/25 0900) Pulse Rate: 93 (08/25 0900)  Labs:  Recent Labs  07/07/16 0243 07/06/2016 0831 06/16/2016 0834  HGB 10.8*  --  7.7*  HCT 32.1*  --  22.6*  PLT 395  --  240  LABPROT  --  44.2*  --   INR  --  4.53*  --   CREATININE 1.08  --  0.92    Estimated Creatinine Clearance: 79.5 mL/min (by C-G formula based on SCr of 0.92 mg/dL).   Medical History: Past Medical History:  Diagnosis Date  . Cancer ([place])    colon, liver mets per record from [facility]  . GERD (gastroesophageal reflux disease)    use to have reflux, reports that lately he hhas been regurgitating "foamy" substance   . Heart murmur    told that he has a "light murmur"   . Hypertension   . Kidney stones 1980's   both times treated with cystoscopy    Medications:   (Not in a hospital admission) Scheduled:  . sodium chloride flush  3 mL Intravenous Q12H  . vancomycin  750 mg Intravenous Q12H    Assessment: 25 yoM admitted on 8/25 with bacteremia.  PMH significant for metastatic rectal cancer, and DVT on 06/30/16 and started on Xarelto.  Pharmacy is consulted to resume Xarelto dosing inpatient.  PTA Xarelto 15 mg BID x21 days (started on 07/02/16), then Xarelto 20mg  daily. Last dose taken on 824 PM.  Today, 06/28/2016: Day # 8 of 15 mg tablets. SCr 0.92 with Crcl ~ 80 ml/min CBC:  Hgb low at 7.7 and Plt WNL at 240  Goal of Therapy:  Appropriate Xarelto dosing Monitor platelets by anticoagulation protocol: Yes   Plan:  Xarelto 15 mg BID to complete 21 days on 9/7 pm. Then followed by, Xarelto 20mg  PO once daily starting on 9/8 Pharmacy will sign off note writing, but will f/u peripherally    Gretta Arab PharmD, BCPS Pager [phone] 07/15/2016 10:57 AM

## 2016-07-09 NOTE — Progress Notes (Signed)
Pharmacy Antibiotic Note  Andre Fernandez is a 60 y.o. male admitted on 07/02/2016.  He was called to return to ED for positive blood cultures obtained at ED on 8/23.  PMH includes metastatic colon cancer (cycle 1 FOLFOX chemo on 07/06/16), DVT on Xarelto, sacral ulcer, and US paracentesis on 07/06/16.  Pharmacy has been consulted for Vancomycin and Cefepime dosing.  SCr 1.08, CrCl ~ 68 ml/min Lactic acid 2.74 WBC 20.4  Plan:  Cefepime 2g IV stat, then 1g IV q8h  Vancomycin 1g IV stat, then 750 mg IV q12h.  Measure Vanc trough at steady state (goal 15-20)  Follow up renal fxn, culture results, and clinical course.  Height: 6\' 1"  (185.4 cm) Weight: 145 lb (65.8 kg) IBW/kg (Calculated) : 79.9  Temp (24hrs), Avg:97.2 F (36.2 C), Min:96.9 F (36.1 C), Max:97.5 F (36.4 C)   Recent Labs Lab 07/06/16 0803 07/06/16 0804 07/07/16 0243 07/12/2016 0834 07/05/2016 0837  WBC 28.1*  --  30.2* 20.4*  --   CREATININE  --  0.8 1.08  --   --   LATICACIDVEN  --   --   --   --  2.74*    Estimated Creatinine Clearance: 67.7 mL/min (by C-G formula based on SCr of 1.08 mg/dL).    No Known Allergies  Antimicrobials this admission: 8/25 Cefepime >>  8/25 Vancomycin >>   Dose adjustments this admission:   Microbiology results: 8/23 (From previous ED visit) BCx: gram variable rod (aerobic bottle only) 8/23 BCID: NONE DETECTED 8/25 BCx: sent  Thank you for allowing pharmacy to be a part of this patient's care.  Gretta Arab PharmD, BCPS Pager [phone] 07/06/2016 8:59 AM

## 2016-07-09 NOTE — ED Notes (Signed)
Patient came to ER bc labs came back abnormal. Doesn't feel bad just a little weak.

## 2016-07-09 NOTE — H&P (Signed)
History and Physical    Andre Fernandez [id number] DOB: Jun 03, 1956 DOA: 07/01/2016  PCP: Pcp Not In System  Outpatient Specialists: [name], oncology Patient coming from: home  Chief Complaint: Positive blood cultures  HPI: Andre Fernandez is a 60 y.o. 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 [name] from oncology and has started chemotherapy with round 1 which was about 2 days ago. He presented to the emergency room yesterday due to intractable nausea and vomiting, and improvement in the ED and was sent home. Because of persistent leukocytosis, his white count was 13,000 yesterday, blood cultures were obtained. These speciated gram variable rods today, and patient was called and asked to return to the emergency room. EDP discussed with infectious disease, who recommended vancomycin and cefepime and admission to the hospital. Patient tells me that he has been feeling quite weak in the last week, following chemotherapy he started having intractable nausea and vomiting, and is actively vomiting on my interview in the emergency room. He denies any fever or chills. He has occasional chest/epigastric pains, following his surgery. He denies any shortness of breath. He complains of left lower extremity swelling, and was recently diagnosed with a DVT and started on Xarelto it appears around 06/30/16. He complains of generalized weakness. He denies any lightheadedness or dizziness.  ED Course: In the ED, patient's vital signs are stable other than sinus tachycardia, he was found to have an elevated white count of 20.4, hemoglobin of 7.7, he is fecal occult positive, lactic acid of 2.7, he has elevated LFTs with alkaline phosphatase of 1243, and a mildly hypokalemic with a potassium of 3.1. TRH was asked for  admission for further evaluation and treatment  Review of Systems: As per HPI otherwise 10 point review of systems negative.   Past Medical History:  Diagnosis Date  . Cancer ([place])    colon, liver mets per record from [place]  . GERD (gastroesophageal reflux disease)    use to have reflux, reports that lately he hhas been regurgitating "foamy" substance   . Heart murmur    told that he has a "light murmur"   . Hypertension   . Kidney stones 1980's   both times treated with cystoscopy    Past Surgical History:  Procedure Laterality Date  . CYSTOSCOPY    . insertion of a  port a cath  06/18/2016  . LAPAROSCOPIC DIVERTED COLOSTOMY  06/18/2016  . LAPAROSCOPIC DIVERTED COLOSTOMY N/A 06/18/2016   Procedure: LAPAROSCOPIC DIVERTED COLOSTOMY;  Surgeon: Stark Klein, MD;  Location: [facility];  Service: General;  Laterality: N/A;  . PORTACATH PLACEMENT N/A 06/18/2016   Procedure: INSERTION PORT-A-CATH;  Surgeon: Stark Klein, MD;  Location: [facility];  Service: General;  Laterality: N/A;     reports that he quit smoking about 4 years ago. His smoking use included Cigarettes. He has never used smokeless tobacco. He reports that he does not drink alcohol or use drugs.  No Known Allergies  History reviewed. No pertinent family history.  Prior to Admission medications   Medication Sig Start Date End Date Taking? Authorizing Provider  bimatoprost (LUMIGAN) 0.03 % ophthalmic solution Place 1 drop into both eyes at bedtime.   Yes Historical Provider, MD  bisoprolol-hydrochlorothiazide (ZIAC) 10-6.25 MG tablet Take 1 tablet by mouth every evening.  05/27/16  Yes Historical Provider, MD  lidocaine-prilocaine (EMLA)  cream Apply to port site one hour prior to use. Do not rub in, cover with plastic. Patient taking differently: Apply 1 application topically daily as needed (port access). Apply to port site one hour prior to use. Do not rub in, cover with plastic. 07/02/16  Yes Ladell Pier, MD  methocarbamol  (ROBAXIN) 500 MG tablet Take 1 tablet (500 mg total) by mouth every 8 (eight) hours as needed for muscle spasms. 06/26/16  Yes Darci Current Simaan, PA-C  oxyCODONE-acetaminophen (PERCOCET/ROXICET) 5-325 MG tablet Take 1-2 tablets by mouth every 4 (four) hours as needed for severe pain. 06/26/16  Yes Darci Current Simaan, PA-C  Polyethylene Glycol 3350 (MIRALAX PO) Take 17 g by mouth daily. Mix in 8 ounces of liquid   Yes Ladell Pier, MD  prochlorperazine (COMPAZINE) 10 MG tablet Take 1 tablet (10 mg total) by mouth every 6 (six) hours as needed for nausea or vomiting. 07/02/16  Yes Ladell Pier, MD  Rivaroxaban 15 & 20 MG TBPK Take as directed on package: Start with one 15mg  tablet by mouth twice a day with food. On Day 22, switch to one 20mg  tablet once a day with food. 06/30/16  Yes Owens Shark, NP  senna-docusate (SENNA S) 8.6-50 MG tablet Take 1-2 tablets by mouth 2 (two) times daily as needed for moderate constipation (take 1 tablet twice daily every day and if needed). take 1 tablet twice daily every day and if needed Can increase to #2 twice daily  Or can decrease to daily if needed   Yes Ladell Pier, MD  fluconazole (DIFLUCAN) 100 MG tablet Take 1 tablet (100 mg total) by mouth daily. Patient not taking: Reported on 07/07/2016 07/02/16   Owens Shark, NP    Physical Exam: Vitals:   07/04/2016 [id number] 06/25/2016 0633 07/06/2016 0900  BP: 112/84  103/77  Pulse: 119  93  Resp: 16  18  Temp: 97.5 F (36.4 C)    TempSrc: Oral    SpO2: 96%  95%  Weight:  65.8 kg (145 lb)   Height:  6\' 1"  (1.854 m)     Constitutional: Thin, ill-appearing African-American gentleman, uncomfortable, actively vomiting Vitals:   06/29/2016 0632 06/30/2016 0633 07/01/2016 0900  BP: 112/84  103/77  Pulse: 119  93  Resp: 16  18  Temp: 97.5 F (36.4 C)    TempSrc: Oral    SpO2: 96%  95%  Weight:  65.8 kg (145 lb)   Height:  6\' 1"  (1.854 m)    Eyes: PERRL ENMT: Mucous membranes are dry. Neck: normal,  supple Respiratory: clear to auscultation bilaterally, no wheezing, no crackles. Normal respiratory effort. No accessory muscle use.  Cardiovascular: Regular rate and rhythm, no murmurs / rubs / gallops. LLE edema, 1+ pitting. 2+ pedal pulses. Abdomen: no tenderness,Bowel sounds positive.  ostomy bag in place with brown stool  Musculoskeletal: no clubbing / cyanosis. No joint deformity upper and lower extremities. Decreased muscle tone.  Skin: no rashes, lesions, ulcers. No induration. 3-4 cm stage II sacral decubitus ulcer  Neurologic: nonfocal   Psychiatric: Normal judgment and insight. Alert and oriented x 3. Normal mood.   Labs on Admission: I have personally reviewed following labs and imaging studies  CBC:  Recent Labs Lab 07/06/16 0803 07/07/16 0243 06/21/2016 0834  WBC 28.1* 30.2* 20.4*  NEUTROABS 24.5*  --  19.2*  HGB 10.6* 10.8* 7.7*  HCT 31.7* 32.1* 22.6*  MCV 78.5* 78.9 77.9*  PLT 453* 395 240  Basic Metabolic Panel:  Recent Labs Lab 07/06/16 0804 07/07/16 0243 07/10/2016 0834  NA 137 136 140  K 4.3 5.5* 3.1*  CL  --  98* 108  CO2 24 24 23   GLUCOSE 135 120* 105*  BUN 24.3 47* 48*  CREATININE 0.8 1.08 0.92  CALCIUM 10.9* 10.3 7.5*   GFR: Estimated Creatinine Clearance: 79.5 mL/min (by C-G formula based on SCr of 0.92 mg/dL). Liver Function Tests:  Recent Labs Lab 07/06/16 0804 07/07/16 0243 06/22/2016 0834  AST 104* 99* 53*  ALT 52 47 35  ALKPHOS 2,718 Repeated and Verified* 1,885* 1,243*  BILITOT 1.47* 1.5* 1.0  PROT 6.3* 6.7 5.0*  ALBUMIN 1.8* 2.3* 1.7*   No results for input(s): LIPASE, AMYLASE in the last 168 hours. No results for input(s): AMMONIA in the last 168 hours. Coagulation Profile:  Recent Labs Lab 06/15/2016 0831  INR 4.53*   Cardiac Enzymes: No results for input(s): CKTOTAL, CKMB, CKMBINDEX, TROPONINI in the last 168 hours. BNP (last 3 results) No results for input(s): PROBNP in the last 8760 hours. HbA1C: No results for  input(s): HGBA1C in the last 72 hours. CBG: No results for input(s): GLUCAP in the last 168 hours. Lipid Profile: No results for input(s): CHOL, HDL, LDLCALC, TRIG, CHOLHDL, LDLDIRECT in the last 72 hours. Thyroid Function Tests: No results for input(s): TSH, T4TOTAL, FREET4, T3FREE, THYROIDAB in the last 72 hours. Anemia Panel: No results for input(s): VITAMINB12, FOLATE, FERRITIN, TIBC, IRON, RETICCTPCT in the last 72 hours. Urine analysis:    Component Value Date/Time   COLORURINE YELLOW 07/14/2016 0730   APPEARANCEUR CLOUDY (A) 07/11/2016 0730   LABSPEC 1.028 06/24/2016 0730   PHURINE 5.0 07/05/2016 0730   GLUCOSEU NEGATIVE 07/02/2016 0730   HGBUR NEGATIVE 07/02/2016 0730   BILIRUBINUR NEGATIVE 06/20/2016 0730   KETONESUR NEGATIVE 06/25/2016 0730   PROTEINUR NEGATIVE 07/03/2016 0730   NITRITE NEGATIVE 06/22/2016 0730   LEUKOCYTESUR NEGATIVE 07/01/2016 0730    Recent Results (from the past 240 hour(s))  TECHNOLOGIST REVIEW     Status: None   Collection Time: 07/06/16  8:03 AM  Result Value Ref Range Status   Technologist Review few ovalo & targets. few large plt.  Final  Culture, blood (routine x 2)     Status: None (Preliminary result)   Collection Time: 07/07/16  8:45 PM  Result Value Ref Range Status   Specimen Description BLOOD RIGHT ANTECUBITAL  Final   Special Requests BOTTLES DRAWN AEROBIC AND ANAEROBIC 5CC EACH  Final   Culture  Setup Time   Final    GRAM VARIABLE ROD AEROBIC BOTTLE ONLY CRITICAL RESULT CALLED TO, READ BACK BY AND VERIFIED WITHJomarie Longs RN [id number] 06/18/2016 A BROWNING Performed at [facility]    Culture PENDING  Incomplete   Report Status PENDING  Incomplete  Blood Culture ID Panel (Reflexed)     Status: None   Collection Time: 07/07/16  8:45 PM  Result Value Ref Range Status   Enterococcus species NOT DETECTED NOT DETECTED Final   Vancomycin resistance NOT DETECTED NOT DETECTED Final   Listeria monocytogenes NOT DETECTED NOT DETECTED  Final   Staphylococcus species NOT DETECTED NOT DETECTED Final   Staphylococcus aureus NOT DETECTED NOT DETECTED Final   Methicillin resistance NOT DETECTED NOT DETECTED Final   Streptococcus species NOT DETECTED NOT DETECTED Final   Streptococcus agalactiae NOT DETECTED NOT DETECTED Final   Streptococcus pneumoniae NOT DETECTED NOT DETECTED Final   Streptococcus pyogenes NOT DETECTED NOT DETECTED Final  Acinetobacter baumannii NOT DETECTED NOT DETECTED Final   Enterobacteriaceae species NOT DETECTED NOT DETECTED Final   Enterobacter cloacae complex NOT DETECTED NOT DETECTED Final   Escherichia coli NOT DETECTED NOT DETECTED Final   Klebsiella oxytoca NOT DETECTED NOT DETECTED Final   Klebsiella pneumoniae NOT DETECTED NOT DETECTED Final   Proteus species NOT DETECTED NOT DETECTED Final   Serratia marcescens NOT DETECTED NOT DETECTED Final   Carbapenem resistance NOT DETECTED NOT DETECTED Final   Haemophilus influenzae NOT DETECTED NOT DETECTED Final   Neisseria meningitidis NOT DETECTED NOT DETECTED Final   Pseudomonas aeruginosa NOT DETECTED NOT DETECTED Final   Candida albicans NOT DETECTED NOT DETECTED Final   Candida glabrata NOT DETECTED NOT DETECTED Final   Candida krusei NOT DETECTED NOT DETECTED Final   Candida parapsilosis NOT DETECTED NOT DETECTED Final   Candida tropicalis NOT DETECTED NOT DETECTED Final    Comment: Performed at [facility]     Radiological Exams on Admission: Dg Chest 2 View  Result Date: 06/19/2016 CLINICAL DATA:  Metastatic colon cancer.  Cough. EXAM: CHEST  2 VIEW COMPARISON:  07/07/2016 chest radiograph. FINDINGS: Left subclavian Port-A-Cath terminates at the cavoatrial junction. Stable cardiomediastinal silhouette with normal heart size. No pneumothorax. Stable trace right and small left pleural effusions. No pulmonary edema. Stable patchy left lung base opacity. No acute consolidative airspace disease. Partially visualized mildly dilated  small bowel loops in the mid abdomen. IMPRESSION: 1. Stable trace right and small left pleural effusions. 2. Stable patchy left lung base opacity, which could represent atelectasis, aspiration and/or pneumonia. 3. Partially visualized mildly dilated small bowel loops in the mid abdomen, not appreciably changed. Electronically Signed   By: Ilona Sorrel M.D.   On: 07/01/2016 07:58   Ct Abdomen Pelvis W Contrast  Result Date: 07/07/2016 CLINICAL DATA:  Colon cancer, beginning treatment today. Vomiting. Status post diverting colostomy on June 18, 2016 and paracentesis 1 day ago. EXAM: CT ABDOMEN AND PELVIS WITH CONTRAST TECHNIQUE: Multidetector CT imaging of the abdomen and pelvis was performed using the standard protocol following bolus administration of intravenous contrast. CONTRAST:  110mL ISOVUE-300 IOPAMIDOL (ISOVUE-300) INJECTION 61% COMPARISON:  CT abdomen and pelvis June 07, 2016 FINDINGS: LUNG BASES: Moderate bilateral pleural effusions, increased from prior CT. Multiple sub cm RIGHT middle lobe, lingular nodules/metastasis, 1 of which is new. Studding of the RIGHT hemidiaphragm. Heart size is normal. No pericardial effusion . SOLID ORGANS: Diffuse hepatic metastasis, RIGHT lower lobe mass was 7.7 x 6.4 cm, now 8 x 6.7 cm, mildly compressing the intrahepatic inferior vena cava. Metastasis in LEFT lobe of the liver was 4.9 x 4.3 cm, now 6.3 x 5.1 cm. Increasing splenic metastasis suspected. Adrenal glands are normal. Mild mass effect on the pancreas due to ascites, otherwise unremarkable. Gallbladder is decompressed. GASTROINTESTINAL TRACT: 6.8 x 8.3 cm mass in the pelvis is now 6.8 x 9.8 cm with central air density corresponding to patient's known primary neoplasm. Surrounding lymphadenopathy, including 2.2 cm mesenteric lymph node with extensive surrounding fat stranding. Extensive studding of the mesenteries/omentum has increased from prior examination with matted appearance. Moderate amount of ascites  with thickened enhancing peritoneal. A few bubbles of pneumoperitoneum in the RIGHT pericolic gutter and in the pelvis. New LEFT lower quadrant diverting colostomy. Gas distended small bowel to 4.3 cm. KIDNEYS/ URINARY TRACT: Kidneys are orthotopic, demonstrating symmetric enhancement. Nonobstructing punctate RIGHT lower pole nephrolithiasis. 3 mm LEFT interpolar nephrolithiasis without obstruction. No hydronephrosis or solid renal masses. The unopacified ureters are  normal in course and caliber. Delayed imaging through the kidneys demonstrates symmetric prompt contrast excretion within the proximal urinary collecting system. Urinary bladder is partially distended and unremarkable. PERITONEUM/RETROPERITONEUM: Aortoiliac vessels are normal in course and caliber, mild calcific atherosclerosis. RIGHT peritoneal/diaphragmatic fluid collections have increased from prior examination, previously 7 x 14 mm, not 12 x 33 mm. SOFT TISSUE/OSSEOUS STRUCTURES: Non-suspicious. IMPRESSION: New moderate bilateral pleural effusions and at least 1 new though lung metastasis. Increasing size of hepatic metastasis consistent with disease progression. Enlarging necrotic colonic mass. Small amount of pneumoperitoneum, which may be secondary to yesterday's paracentesis or, perforated hollow viscus organ. Moderate amount of ascites, with omental caking and mesenteric lymphadenopathy consistent with disease progression. Fluid collection along the peritoneum and diaphragm most compatible with metastasis, less likely abscess. New diverting colostomy, with mild ileus. Acute findings discussed with and reconfirmed by [facility] on 07/07/2016 at 10:15 pm. Electronically Signed   By: Elon Alas M.D.   On: 07/07/2016 22:16   Dg Abd Acute W/chest  Result Date: 07/07/2016 CLINICAL DATA:  Colon cancer, vomiting EXAM: DG ABDOMEN ACUTE W/ 1V CHEST COMPARISON:  CT chest dated 06/30/2016 CT abdomen pelvis dated 06/07/2016. FINDINGS: Mild  ill-defined right lower lung opacities, likely atelectasis. Left lower lobe opacity, likely atelectasis. Small pulmonary nodules/ metastases on recent CT are not radiographically evident. Moderate left and small right pleural effusions.  No pneumothorax. Cardiomegaly. Left chest port terminating at the cavoatrial junction. Multiple mildly dilated loops of small bowel in the central abdomen. Ostomy in the left lower quadrant. No evidence of free air under the diaphragm on the upright view. Visualized osseous structures are within normal limits. IMPRESSION: Moderate left and small right pleural effusions. Associated bilateral lower lobe opacities, likely atelectasis. Known small pulmonary nodules/ metastases are not radiographically evident. Multiple mildly dilated small bowel in the central abdomen, nonspecific but worrisome for small bowel obstruction. Correlate with ostomy output. Electronically Signed   By: Julian Hy M.D.   On: 07/07/2016 21:27   Assessment/Plan Active Problems:   Colon cancer metastasized to multiple sites [facility])   Ascites   Bacteremia   DVT (deep venous thrombosis) (HCC)   Leukocytosis (leucocytosis)   Colostomy in place [facility])   Severe malnutrition ([facility])   Decubitus ulcer   Liver metastasis ([facility])   Anemia    Gram variable rods bacteremia  - Patient's reflexes ID panel was negative, however regular blood cultures are positive  - Appreciate ID input, continue vancomycin and cefepime until final speciation  - He did receive chemotherapy 2 days ago, closely monitor patient's counts and whether he would become neutropenic   Leukocytosis - Patient has been having persistent leukocytosis since most recent hospitalization, was as high as 30 on 8/23, it is 20 on admission, with a down trending may be related to recent chemotherapy  Possible aspiration pneumonia - Patient actively vomiting in the ED, his oxygen saturation is in the low 90s, chest x-ray with potential  infiltrate, he is on vancomycin and cefepime already  Anemia - Likely related to chemotherapy, he has no bleeding in his ostomy bag, there is no melena, his fecal occult positive however without clinical active bleeding. We'll continue to closely monitor his counts  Stage IV colon cancer with liver metastasis - Care as per oncology as an outpatient - He does have elevated alkaline phosphatase related to his liver metastasis  Recent DVT - Xarelto per pharmacy  Stage II decubitus ulcer - Wound care  Severe malnutrition - Dietary consult -  Albumin at 1.7  Chronic cancer induced pain - Resume home medications  Hypertension - Given normal blood pressure, clinical dehydration as well as bacteremia, hold his home bisoprolol hydrochlorothiazide    DVT prophylaxis: Xarelto  Code Status: Full  Family Communication: no family bedside Disposition Plan: home TBD Consults called: ID, discussed with Dr.Snider, will not see formally, would like to touch base with ID once speciation is back Admission status: inpatient    Marzetta Board, MD [facility] Pager [phone]  If 7PM-7AM, please contact night-coverage [email or web address] Password [email or web address]  07/08/2016, 10:30 AM

## 2016-07-09 NOTE — ED Notes (Signed)
Pt can go to floor at 10:35.

## 2016-07-09 NOTE — Progress Notes (Signed)
Nursing staff in with pt when   PCP is Dr Oneida Alar in [facility] CM unable to find contact information for Dr Oneida Alar via goggle

## 2016-07-09 NOTE — ED Notes (Signed)
Pt family called by patient placement to come in following lab results. Pt family reports that they said he had positive cultures and and elevated WBCs.

## 2016-07-09 NOTE — ED Provider Notes (Signed)
[facility] DEPT Provider Note   CSN: [id number] Arrival date & time: 07/13/2016  [id number]     History   Chief Complaint Chief Complaint  Patient presents with  . Abnormal Lab    HPI Andre Fernandez is a 60 y.o. male.  HPI 60 year old male who presents with abnormal blood culture. With history of metastatic colon cancer status post diverting colostomy and cycle 1 of chemotherapy 07/06/2016, DVT on Xarelto, sacral ulcer, and US paracentesis on 07/06/2016. Presented to ED 07/07/2016 after chemotherapy and paracentesis with abdominal pain, nausea and vomiting. Had a CT scan showing progression of his malignancy as well as pneumoperitoneum felt to be due to his recent paracentesis. At that time had increasing leukocytosis of 30. EDP have spoken with Dr. Burr Medico from oncology who felt that the presentation was consistent with that of his ongoing cancer. Patient discharged and followed with oncology yesterday.   The patient received a phone call this morning from ED asking patient to return to ED for evaluation for positive blood culture. Blood culture drawn on ED visit August 23 , positive for gram variable rods. Family states that since his surgery and chemotherapy he has become more weak with some decreased appetite. Did have 1-2 episodes of vomiting this morning. States that abdominal pain has significantly improved since recent ED visit. No fevers, chills or night sweats. States that sacral wound has had some drainage recently, but not foul-smelling. Normal ostomy output per family. Mild nonproductive cough. No urinary complaints.   Past Medical History:  Diagnosis Date  . Cancer ([place])    colon, liver mets per record from [place]  . GERD (gastroesophageal reflux disease)    use to have reflux, reports that lately he hhas been regurgitating "foamy" substance   . Heart murmur    told that he has a "light murmur"   . Hypertension   . Kidney stones 1980's   both times treated with cystoscopy     Patient Active Problem List   Diagnosis Date Noted  . Bacteremia 06/26/2016  . Hypercalcemia 07/07/2016  . Ascites 07/07/2016  . Colon cancer metastasized to multiple sites [facility]) 06/18/2016    Past Surgical History:  Procedure Laterality Date  . CYSTOSCOPY    . insertion of a  port a cath  06/18/2016  . LAPAROSCOPIC DIVERTED COLOSTOMY  06/18/2016  . LAPAROSCOPIC DIVERTED COLOSTOMY N/A 06/18/2016   Procedure: LAPAROSCOPIC DIVERTED COLOSTOMY;  Surgeon: Stark Klein, MD;  Location: [facility];  Service: General;  Laterality: N/A;  . PORTACATH PLACEMENT N/A 06/18/2016   Procedure: INSERTION PORT-A-CATH;  Surgeon: Stark Klein, MD;  Location: [facility];  Service: General;  Laterality: N/A;       Home Medications    Prior to Admission medications   Medication Sig Start Date End Date Taking? Authorizing Provider  bimatoprost (LUMIGAN) 0.03 % ophthalmic solution Place 1 drop into both eyes at bedtime.   Yes Historical Provider, MD  bisoprolol-hydrochlorothiazide (ZIAC) 10-6.25 MG tablet Take 1 tablet by mouth every evening.  05/27/16  Yes Historical Provider, MD  lidocaine-prilocaine (EMLA) cream Apply to port site one hour prior to use. Do not rub in, cover with plastic. Patient taking differently: Apply 1 application topically daily as needed (port access). Apply to port site one hour prior to use. Do not rub in, cover with plastic. 07/02/16  Yes Ladell Pier, MD  methocarbamol (ROBAXIN) 500 MG tablet Take 1 tablet (500 mg total) by mouth every 8 (eight) hours as needed for muscle spasms.  06/26/16  Yes Darci Current Simaan, PA-C  oxyCODONE-acetaminophen (PERCOCET/ROXICET) 5-325 MG tablet Take 1-2 tablets by mouth every 4 (four) hours as needed for severe pain. 06/26/16  Yes Darci Current Simaan, PA-C  Polyethylene Glycol 3350 (MIRALAX PO) Take 17 g by mouth daily. Mix in 8 ounces of liquid   Yes Ladell Pier, MD  prochlorperazine (COMPAZINE) 10 MG tablet Take 1 tablet (10 mg total) by mouth every 6  (six) hours as needed for nausea or vomiting. 07/02/16  Yes Ladell Pier, MD  Rivaroxaban 15 & 20 MG TBPK Take as directed on package: Start with one 15mg  tablet by mouth twice a day with food. On Day 22, switch to one 20mg  tablet once a day with food. 06/30/16  Yes Owens Shark, NP  senna-docusate (SENNA S) 8.6-50 MG tablet Take 1-2 tablets by mouth 2 (two) times daily as needed for moderate constipation (take 1 tablet twice daily every day and if needed). take 1 tablet twice daily every day and if needed Can increase to #2 twice daily  Or can decrease to daily if needed   Yes Ladell Pier, MD  fluconazole (DIFLUCAN) 100 MG tablet Take 1 tablet (100 mg total) by mouth daily. Patient not taking: Reported on 07/07/2016 07/02/16   Owens Shark, NP    Family History History reviewed. No pertinent family history.  Social History Social History  Substance Use Topics  . Smoking status: Former Smoker    Types: Cigarettes    Quit date: 11/16/2011  . Smokeless tobacco: Never Used  . Alcohol use No     Comment: Quit 3 years ago     Allergies   Review of patient's allergies indicates no known allergies.   Review of Systems Review of Systems 10/14 systems reviewed and are negative other than those stated in the HPI   Physical Exam Updated Vital Signs BP 103/77 (BP Location: Left Arm)   Pulse 93   Temp 97.5 F (36.4 C) (Oral)   Resp 18   Ht 6\' 1"  (1.854 m)   Wt 145 lb (65.8 kg)   SpO2 95%   BMI 19.13 kg/m   Physical Exam Physical Exam  Nursing note and vitals reviewed. Constitutional: Chronically ill appearing, cachectic, non-toxic, and in no acute distress Head: Normocephalic and atraumatic.  Mouth/Throat: Oropharynx is clear and dry.  Neck: Normal range of motion. Neck supple.  Cardiovascular: Tachycardic rate and regular rhythm.   Pulmonary/Chest: Effort normal and breath sounds normal.  Abdominal: Soft. Moderate distension. There is no tenderness. There is no rebound and  no guarding.  Musculoskeletal: Normal range of motion.  Neurological: Alert, no facial droop, fluent speech, moves all extremities symmetrically Skin: Skin is warm and dry. Sacral decubitus ulcer in pressure dressing Psychiatric: Cooperative   ED Treatments / Results  Labs (all labs ordered are listed, but only abnormal results are displayed) Labs Reviewed  CBC WITH DIFFERENTIAL/PLATELET - Abnormal; Notable for the following:       Result Value   WBC 20.4 (*)    RBC 2.90 (*)    Hemoglobin 7.7 (*)    HCT 22.6 (*)    MCV 77.9 (*)    RDW 19.8 (*)    Neutro Abs 19.2 (*)    Lymphs Abs 0.2 (*)    Eosinophils Absolute 0.8 (*)    All other components within normal limits  COMPREHENSIVE METABOLIC PANEL - Abnormal; Notable for the following:    Potassium 3.1 (*)  Glucose, Bld 105 (*)    BUN 48 (*)    Calcium 7.5 (*)    Total Protein 5.0 (*)    Albumin 1.7 (*)    AST 53 (*)    Alkaline Phosphatase 1,243 (*)    All other components within normal limits  URINALYSIS, ROUTINE W REFLEX MICROSCOPIC (NOT AT [facility]) - Abnormal; Notable for the following:    APPearance CLOUDY (*)    All other components within normal limits  I-STAT CG4 LACTIC ACID, ED - Abnormal; Notable for the following:    Lactic Acid, Venous 2.74 (*)    All other components within normal limits  POC OCCULT BLOOD, ED - Abnormal; Notable for the following:    Fecal Occult Bld POSITIVE (*)    All other components within normal limits  CULTURE, BLOOD (ROUTINE X 2)  CULTURE, BLOOD (ROUTINE X 2)  URINE CULTURE  PROTIME-INR  I-STAT CG4 LACTIC ACID, ED    EKG  EKG Interpretation None       Radiology Dg Chest 2 View  Result Date: 06/29/2016 CLINICAL DATA:  Metastatic colon cancer.  Cough. EXAM: CHEST  2 VIEW COMPARISON:  07/07/2016 chest radiograph. FINDINGS: Left subclavian Port-A-Cath terminates at the cavoatrial junction. Stable cardiomediastinal silhouette with normal heart size. No pneumothorax. Stable trace  right and small left pleural effusions. No pulmonary edema. Stable patchy left lung base opacity. No acute consolidative airspace disease. Partially visualized mildly dilated small bowel loops in the mid abdomen. IMPRESSION: 1. Stable trace right and small left pleural effusions. 2. Stable patchy left lung base opacity, which could represent atelectasis, aspiration and/or pneumonia. 3. Partially visualized mildly dilated small bowel loops in the mid abdomen, not appreciably changed. Electronically Signed   By: Ilona Sorrel M.D.   On: 07/13/2016 07:58   Ct Abdomen Pelvis W Contrast  Result Date: 07/07/2016 CLINICAL DATA:  Colon cancer, beginning treatment today. Vomiting. Status post diverting colostomy on June 18, 2016 and paracentesis 1 day ago. EXAM: CT ABDOMEN AND PELVIS WITH CONTRAST TECHNIQUE: Multidetector CT imaging of the abdomen and pelvis was performed using the standard protocol following bolus administration of intravenous contrast. CONTRAST:  137mL ISOVUE-300 IOPAMIDOL (ISOVUE-300) INJECTION 61% COMPARISON:  CT abdomen and pelvis June 07, 2016 FINDINGS: LUNG BASES: Moderate bilateral pleural effusions, increased from prior CT. Multiple sub cm RIGHT middle lobe, lingular nodules/metastasis, 1 of which is new. Studding of the RIGHT hemidiaphragm. Heart size is normal. No pericardial effusion . SOLID ORGANS: Diffuse hepatic metastasis, RIGHT lower lobe mass was 7.7 x 6.4 cm, now 8 x 6.7 cm, mildly compressing the intrahepatic inferior vena cava. Metastasis in LEFT lobe of the liver was 4.9 x 4.3 cm, now 6.3 x 5.1 cm. Increasing splenic metastasis suspected. Adrenal glands are normal. Mild mass effect on the pancreas due to ascites, otherwise unremarkable. Gallbladder is decompressed. GASTROINTESTINAL TRACT: 6.8 x 8.3 cm mass in the pelvis is now 6.8 x 9.8 cm with central air density corresponding to patient's known primary neoplasm. Surrounding lymphadenopathy, including 2.2 cm mesenteric lymph node  with extensive surrounding fat stranding. Extensive studding of the mesenteries/omentum has increased from prior examination with matted appearance. Moderate amount of ascites with thickened enhancing peritoneal. A few bubbles of pneumoperitoneum in the RIGHT pericolic gutter and in the pelvis. New LEFT lower quadrant diverting colostomy. Gas distended small bowel to 4.3 cm. KIDNEYS/ URINARY TRACT: Kidneys are orthotopic, demonstrating symmetric enhancement. Nonobstructing punctate RIGHT lower pole nephrolithiasis. 3 mm LEFT interpolar nephrolithiasis without obstruction. No hydronephrosis or  solid renal masses. The unopacified ureters are normal in course and caliber. Delayed imaging through the kidneys demonstrates symmetric prompt contrast excretion within the proximal urinary collecting system. Urinary bladder is partially distended and unremarkable. PERITONEUM/RETROPERITONEUM: Aortoiliac vessels are normal in course and caliber, mild calcific atherosclerosis. RIGHT peritoneal/diaphragmatic fluid collections have increased from prior examination, previously 7 x 14 mm, not 12 x 33 mm. SOFT TISSUE/OSSEOUS STRUCTURES: Non-suspicious. IMPRESSION: New moderate bilateral pleural effusions and at least 1 new though lung metastasis. Increasing size of hepatic metastasis consistent with disease progression. Enlarging necrotic colonic mass. Small amount of pneumoperitoneum, which may be secondary to yesterday's paracentesis or, perforated hollow viscus organ. Moderate amount of ascites, with omental caking and mesenteric lymphadenopathy consistent with disease progression. Fluid collection along the peritoneum and diaphragm most compatible with metastasis, less likely abscess. New diverting colostomy, with mild ileus. Acute findings discussed with and reconfirmed by [facility] on 07/07/2016 at 10:15 pm. Electronically Signed   By: Elon Alas M.D.   On: 07/07/2016 22:16   Dg Abd Acute W/chest  Result Date:  07/07/2016 CLINICAL DATA:  Colon cancer, vomiting EXAM: DG ABDOMEN ACUTE W/ 1V CHEST COMPARISON:  CT chest dated 06/30/2016 CT abdomen pelvis dated 06/07/2016. FINDINGS: Mild ill-defined right lower lung opacities, likely atelectasis. Left lower lobe opacity, likely atelectasis. Small pulmonary nodules/ metastases on recent CT are not radiographically evident. Moderate left and small right pleural effusions.  No pneumothorax. Cardiomegaly. Left chest port terminating at the cavoatrial junction. Multiple mildly dilated loops of small bowel in the central abdomen. Ostomy in the left lower quadrant. No evidence of free air under the diaphragm on the upright view. Visualized osseous structures are within normal limits. IMPRESSION: Moderate left and small right pleural effusions. Associated bilateral lower lobe opacities, likely atelectasis. Known small pulmonary nodules/ metastases are not radiographically evident. Multiple mildly dilated small bowel in the central abdomen, nonspecific but worrisome for small bowel obstruction. Correlate with ostomy output. Electronically Signed   By: Julian Hy M.D.   On: 07/07/2016 21:27    Procedures Procedures (including critical care time)  Medications Ordered in ED Medications  ceFEPIme (MAXIPIME) 2 g in dextrose 5 % 50 mL IVPB (2 g Intravenous New Bag/Given 07/08/2016 0930)  vancomycin (VANCOCIN) IVPB 1000 mg/200 mL premix (1,000 mg Intravenous New Bag/Given 07/08/2016 0931)  ceFEPIme (MAXIPIME) 1 g in dextrose 5 % 50 mL IVPB (not administered)  vancomycin (VANCOCIN) IVPB 750 mg/150 ml premix (not administered)  sodium chloride 0.9 % bolus 1,000 mL (0 mLs Intravenous Stopped 07/05/2016 0939)  ondansetron (ZOFRAN) injection 4 mg (4 mg Intravenous Given 06/23/2016 0820)     Initial Impression / Assessment and Plan / ED Course  I have reviewed the triage vital signs and the nursing notes.  Pertinent labs & imaging results that were available during my care of the  patient were reviewed by me and considered in my medical decision making (see chart for details).  Clinical Course   CRITICAL CARE Performed by: Forde Dandy   Total critical care time: 35 minutes  Critical care time was exclusive of separately billable procedures and treating other patients.  Critical care was necessary to treat or prevent imminent or life-threatening deterioration.  Critical care was time spent personally by me on the following activities: development of treatment plan with patient and/or surrogate as well as nursing, discussions with consultants, evaluation of patient's response to treatment, examination of patient, obtaining history from patient or surrogate, ordering and performing treatments and interventions,  ordering and review of laboratory studies, ordering and review of radiographic studies, pulse oximetry and re-evaluation of patient's condition.   Presenting with positive blood culture from 8/23/207. Chronically ill appearing but in no acute distress. Afebrile and normotensive, but tachycardic (has been tachycardic on almost all ED and clinic visits this month). Down trending leukocytosis, mildly elevated lactate, worsening anemia with guaiac positive brown stool in ostomy. Presentation c/f bacteremia. Obtained repeat blood cultures.  Spoke with ID given unusual gram variable rods on blood culture, cefepime and vancomycin for now. Admitted to hospitalist service.  Final Clinical Impressions(s) / ED Diagnoses   Final diagnoses:  Bacteremia  Leukocytosis    New Prescriptions New Prescriptions   No medications on file     Forde Dandy, MD 07/15/2016 [id number]

## 2016-07-10 LAB — COMPREHENSIVE METABOLIC PANEL
ALK PHOS: 1151 U/L — AB (ref 38–126)
ALT: 33 U/L (ref 17–63)
ANION GAP: 9 (ref 5–15)
AST: 45 U/L — ABNORMAL HIGH (ref 15–41)
Albumin: 1.8 g/dL — ABNORMAL LOW (ref 3.5–5.0)
BILIRUBIN TOTAL: 0.9 mg/dL (ref 0.3–1.2)
BUN: 35 mg/dL — ABNORMAL HIGH (ref 6–20)
CALCIUM: 7.7 mg/dL — AB (ref 8.9–10.3)
CO2: 25 mmol/L (ref 22–32)
CREATININE: 0.83 mg/dL (ref 0.61–1.24)
Chloride: 106 mmol/L (ref 101–111)
Glucose, Bld: 127 mg/dL — ABNORMAL HIGH (ref 65–99)
Potassium: 3.3 mmol/L — ABNORMAL LOW (ref 3.5–5.1)
SODIUM: 140 mmol/L (ref 135–145)
TOTAL PROTEIN: 5.4 g/dL — AB (ref 6.5–8.1)

## 2016-07-10 LAB — CBC
HCT: 23.5 % — ABNORMAL LOW (ref 39.0–52.0)
HEMOGLOBIN: 8 g/dL — AB (ref 13.0–17.0)
MCH: 26.4 pg (ref 26.0–34.0)
MCHC: 34 g/dL (ref 30.0–36.0)
MCV: 77.6 fL — ABNORMAL LOW (ref 78.0–100.0)
PLATELETS: 208 10*3/uL (ref 150–400)
RBC: 3.03 MIL/uL — AB (ref 4.22–5.81)
RDW: 19.9 % — ABNORMAL HIGH (ref 11.5–15.5)
WBC: 13.9 10*3/uL — AB (ref 4.0–10.5)

## 2016-07-10 LAB — URINE CULTURE: CULTURE: NO GROWTH

## 2016-07-10 MED ORDER — POTASSIUM CHLORIDE 20 MEQ/15ML (10%) PO SOLN
40.0000 meq | Freq: Once | ORAL | Status: AC
  Administered 2016-07-10: 40 meq via ORAL
  Filled 2016-07-10: qty 30

## 2016-07-10 MED ORDER — POTASSIUM CHLORIDE CRYS ER 20 MEQ PO TBCR
40.0000 meq | EXTENDED_RELEASE_TABLET | Freq: Once | ORAL | Status: DC
  Filled 2016-07-10: qty 2

## 2016-07-10 MED ORDER — POLYETHYLENE GLYCOL 3350 17 G PO PACK
17.0000 g | PACK | Freq: Every day | ORAL | Status: DC
  Administered 2016-07-10 – 2016-07-13 (×4): 17 g via ORAL
  Filled 2016-07-10 (×4): qty 1

## 2016-07-10 MED ORDER — SODIUM CHLORIDE 0.9% FLUSH
10.0000 mL | INTRAVENOUS | Status: DC | PRN
  Administered 2016-07-10 – 2016-07-13 (×2): 10 mL
  Filled 2016-07-10 (×2): qty 40

## 2016-07-10 NOTE — Progress Notes (Signed)
PROGRESS NOTE  Andre Fernandez [id number] DOB: December 11, 1955 DOA: 07/11/2016 PCP: Pcp Not In System   LOS: 1 day   Brief Narrative: 60 y.o. 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 [name] from oncology and has started chemotherapy with round 1 which was about 2 days prior to admission. Patient was seen in the ED on 8/24, sent home as his symptoms improved, however had blood cultures done. On 8/25 his blood cultures turned positive for gram variable rods and patient was directed to ER and admitted on 8/25.  Assessment & Plan: Active Problems:   Colon cancer metastasized to multiple sites [facility])   Ascites   Bacteremia   DVT (deep venous thrombosis) (HCC)   Leukocytosis (leucocytosis)   Colostomy in place [facility])   Severe malnutrition (HCC)   Decubitus ulcer   Liver metastasis (HCC)   Anemia   Gram variable rods bacteremia  - Patient's reflexes ID panel was negative, however regular blood cultures are positive - Appreciate ID input, continue vancomycin and cefepime until final speciation, will discuss with infectious disease once cultures are finalized - He did receive chemotherapy 2 days PTA, closely monitor patient's counts and whether he would become neutropenic  - repeat cultures obtained this admission remained negative  Leukocytosis - Patient has been having persistent leukocytosis since most recent hospitalization, was as high as 30 on 8/23, it is 20 on admission - White count downtrending to 13.9, may be a component of post chemotherapy  Possible aspiration pneumonia - Patient actively vomiting in the ED, his oxygen saturation is in the low 90s, chest x-ray with potential infiltrate, he is on vancomycin and cefepime already - He is stable, he is on room air, afebrile  Anemia -  Likely related to chemotherapy, he has no bleeding in his ostomy bag, there is no melena, his fecal occult positive however without clinical active bleeding.  - Hemoglobin 8.0 today from 7.7 yesterday  Stage IV colon cancer with liver metastasis - Care as per oncology as an outpatient - He does have elevated alkaline phosphatase related to his liver metastasis  Recent DVT - Xarelto per pharmacy  Stage II decubitus ulcer - Wound care  Severe malnutrition - Dietary consult - Albumin is low  Chronic cancer induced pain - Resume home medications  Hypertension - Given normal blood pressure, clinical dehydration as well as bacteremia, hold his home bisoprolol hydrochlorothiazide - Blood pressure this morning within normal parameters    DVT prophylaxis: On Xarelto Code Status: Full code Family Communication: Discussed with ex-wife at bedside Disposition Plan: Home once blood cultures speciated  Consultants:   None  Procedures:   None   Antimicrobials:  Vancomycin 8/25 >>  Cefepime 8/25 >>   Subjective: - No vomiting since yesterday, he is trying to eat this morning - Denies any shortness of breath, denies any chest pain  Objective: Vitals:   06/17/2016 1417 06/16/2016 2051 07/10/2016 2305 07/10/16 0439  BP: 108/81 102/66 110/76 110/73  Pulse: (!) 103 92 (!) 104 (!) 101  Resp: 18 18 18 16   Temp: 98.1 F (36.7 C) 98.5 F (36.9 C) 97.9 F (36.6 C) 97.8 F (36.6 C)  TempSrc: Oral Oral Oral Oral  SpO2: 96% 97% 97% 97%  Weight:      Height:        Intake/Output Summary (Last 24 hours)  at 07/10/16 0839 Last data filed at 07/10/16 0700  Gross per 24 hour  Intake          1506.25 ml  Output              660 ml  Net           846.25 ml   [facility] Weights   06/28/2016 [id number]  Weight: 65.8 kg (145 lb)    Examination: Constitutional: NAD Vitals:   06/18/2016 1417 06/30/2016 2051 07/04/2016 2305 07/10/16 0439  BP: 108/81 102/66 110/76 110/73  Pulse: (!) 103 92 (!)  104 (!) 101  Resp: 18 18 18 16   Temp: 98.1 F (36.7 C) 98.5 F (36.9 C) 97.9 F (36.6 C) 97.8 F (36.6 C)  TempSrc: Oral Oral Oral Oral  SpO2: 96% 97% 97% 97%  Weight:      Height:       Eyes: PERRL ENMT: Mucous membranes are dry. Respiratory: clear to auscultation bilaterally, no wheezing, no crackles.  Cardiovascular: Regular rate and rhythm, no murmurs / rubs / gallops. LLE edema, 1+ pitting. 2+ pedal pulses. Abdomen: no tenderness,Bowel sounds positive.  ostomy bag in place with brown stool  Musculoskeletal: Decreased muscle tone.  Neurologic: nonfocal     Data Reviewed: I have personally reviewed following labs and imaging studies  CBC:  Recent Labs Lab 07/06/16 0803 07/07/16 0243 06/18/2016 0834 07/10/16 0555  WBC 28.1* 30.2* 20.4* 13.9*  NEUTROABS 24.5*  --  19.2*  --   HGB 10.6* 10.8* 7.7* 8.0*  HCT 31.7* 32.1* 22.6* 23.5*  MCV 78.5* 78.9 77.9* 77.6*  PLT 453* 395 240 [id number]   Basic Metabolic Panel:  Recent Labs Lab 07/06/16 0804 07/07/16 0243 07/15/2016 0830 07/15/2016 0834 07/10/16 0555  NA 137 136  --  140 140  K 4.3 5.5*  --  3.1* 3.3*  CL  --  98*  --  108 106  CO2 24 24  --  23 25  GLUCOSE 135 120*  --  105* 127*  BUN 24.3 47*  --  48* 35*  CREATININE 0.8 1.08  --  0.92 0.83  CALCIUM 10.9* 10.3  --  7.5* 7.7*  MG  --   --  2.3  --   --    GFR: Estimated Creatinine Clearance: 88.1 mL/min (by C-G formula based on SCr of 0.83 mg/dL). Liver Function Tests:  Recent Labs Lab 07/06/16 0804 07/07/16 0243 06/30/2016 0834 07/10/16 0555  AST 104* 99* 53* 45*  ALT 52 47 35 33  ALKPHOS 2,718 Repeated and Verified* 1,885* 1,243* 1,151*  BILITOT 1.47* 1.5* 1.0 0.9  PROT 6.3* 6.7 5.0* 5.4*  ALBUMIN 1.8* 2.3* 1.7* 1.8*   No results for input(s): LIPASE, AMYLASE in the last 168 hours. No results for input(s): AMMONIA in the last 168 hours. Coagulation Profile:  Recent Labs Lab 06/26/2016 0831  INR 4.53*   Cardiac Enzymes: No results for input(s):  CKTOTAL, CKMB, CKMBINDEX, TROPONINI in the last 168 hours. BNP (last 3 results) No results for input(s): PROBNP in the last 8760 hours. HbA1C: No results for input(s): HGBA1C in the last 72 hours. CBG: No results for input(s): GLUCAP in the last 168 hours. Lipid Profile: No results for input(s): CHOL, HDL, LDLCALC, TRIG, CHOLHDL, LDLDIRECT in the last 72 hours. Thyroid Function Tests: No results for input(s): TSH, T4TOTAL, FREET4, T3FREE, THYROIDAB in the last 72 hours. Anemia Panel: No results for input(s): VITAMINB12, FOLATE, FERRITIN, TIBC, IRON, RETICCTPCT in the last 72 hours. Urine analysis:  Component Value Date/Time   COLORURINE YELLOW 06/19/2016 0730   APPEARANCEUR CLOUDY (A) 07/04/2016 0730   LABSPEC 1.028 07/03/2016 0730   PHURINE 5.0 06/26/2016 0730   GLUCOSEU NEGATIVE 07/03/2016 0730   HGBUR NEGATIVE 07/08/2016 0730   BILIRUBINUR NEGATIVE 06/29/2016 0730   KETONESUR NEGATIVE 06/21/2016 0730   PROTEINUR NEGATIVE 06/17/2016 0730   NITRITE NEGATIVE 06/25/2016 0730   LEUKOCYTESUR NEGATIVE 07/15/2016 0730   Sepsis Labs: Invalid input(s): PROCALCITONIN, LACTICIDVEN  Recent Results (from the past 240 hour(s))  TECHNOLOGIST REVIEW     Status: None   Collection Time: 07/06/16  8:03 AM  Result Value Ref Range Status   Technologist Review few ovalo & targets. few large plt.  Final  Culture, blood (routine x 2)     Status: None (Preliminary result)   Collection Time: 07/07/16  8:45 PM  Result Value Ref Range Status   Specimen Description BLOOD RIGHT ANTECUBITAL  Final   Special Requests BOTTLES DRAWN AEROBIC AND ANAEROBIC 5CC EACH  Final   Culture  Setup Time   Final    GRAM VARIABLE ROD AEROBIC BOTTLE ONLY CRITICAL RESULT CALLED TO, READ BACK BY AND VERIFIED WITH: Jomarie Longs RN [phone] 07/07/2016 A BROWNING    Culture   Final    CULTURE REINCUBATED FOR BETTER GROWTH Performed at [facility]    Report Status PENDING  Incomplete  Blood Culture ID Panel (Reflexed)      Status: None   Collection Time: 07/07/16  8:45 PM  Result Value Ref Range Status   Enterococcus species NOT DETECTED NOT DETECTED Final   Vancomycin resistance NOT DETECTED NOT DETECTED Final   Listeria monocytogenes NOT DETECTED NOT DETECTED Final   Staphylococcus species NOT DETECTED NOT DETECTED Final   Staphylococcus aureus NOT DETECTED NOT DETECTED Final   Methicillin resistance NOT DETECTED NOT DETECTED Final   Streptococcus species NOT DETECTED NOT DETECTED Final   Streptococcus agalactiae NOT DETECTED NOT DETECTED Final   Streptococcus pneumoniae NOT DETECTED NOT DETECTED Final   Streptococcus pyogenes NOT DETECTED NOT DETECTED Final   Acinetobacter baumannii NOT DETECTED NOT DETECTED Final   Enterobacteriaceae species NOT DETECTED NOT DETECTED Final   Enterobacter cloacae complex NOT DETECTED NOT DETECTED Final   Escherichia coli NOT DETECTED NOT DETECTED Final   Klebsiella oxytoca NOT DETECTED NOT DETECTED Final   Klebsiella pneumoniae NOT DETECTED NOT DETECTED Final   Proteus species NOT DETECTED NOT DETECTED Final   Serratia marcescens NOT DETECTED NOT DETECTED Final   Carbapenem resistance NOT DETECTED NOT DETECTED Final   Haemophilus influenzae NOT DETECTED NOT DETECTED Final   Neisseria meningitidis NOT DETECTED NOT DETECTED Final   Pseudomonas aeruginosa NOT DETECTED NOT DETECTED Final   Candida albicans NOT DETECTED NOT DETECTED Final   Candida glabrata NOT DETECTED NOT DETECTED Final   Candida krusei NOT DETECTED NOT DETECTED Final   Candida parapsilosis NOT DETECTED NOT DETECTED Final   Candida tropicalis NOT DETECTED NOT DETECTED Final    Comment: Performed at [facility]  Culture, blood (routine x 2)     Status: None (Preliminary result)   Collection Time: 07/07/16 11:56 PM  Result Value Ref Range Status   Specimen Description BLOOD LEFT ANTECUBITAL  Final   Special Requests BOTTLES DRAWN AEROBIC AND ANAEROBIC 5CC  Final   Culture   Final    NO  GROWTH 1 DAY Performed at [facility]    Report Status PENDING  Incomplete      Radiology Studies: Dg Chest 2  View  Result Date: 06/27/2016 CLINICAL DATA:  Metastatic colon cancer.  Cough. EXAM: CHEST  2 VIEW COMPARISON:  07/07/2016 chest radiograph. FINDINGS: Left subclavian Port-A-Cath terminates at the cavoatrial junction. Stable cardiomediastinal silhouette with normal heart size. No pneumothorax. Stable trace right and small left pleural effusions. No pulmonary edema. Stable patchy left lung base opacity. No acute consolidative airspace disease. Partially visualized mildly dilated small bowel loops in the mid abdomen. IMPRESSION: 1. Stable trace right and small left pleural effusions. 2. Stable patchy left lung base opacity, which could represent atelectasis, aspiration and/or pneumonia. 3. Partially visualized mildly dilated small bowel loops in the mid abdomen, not appreciably changed. Electronically Signed   By: Ilona Sorrel M.D.   On: 06/24/2016 07:58     Scheduled Meds: . ceFEPime (MAXIPIME) IV  1 g Intravenous Q8H  . feeding supplement (ENSURE ENLIVE)  237 mL Oral TID BM  . latanoprost  1 drop Both Eyes QHS  . rivaroxaban  15 mg Oral BID   Followed by  . [START ON 07/23/2016] rivaroxaban  20 mg Oral Q supper  . sodium chloride flush  3 mL Intravenous Q12H  . vancomycin  750 mg Intravenous Q12H   Continuous Infusions: . dextrose 5 % and 0.45% NaCl 75 mL/hr at 07/10/16 0145     Marzetta Board, MD, PhD [facility] Pager [phone]  If 7PM-7AM, please contact night-coverage [email or web address] Password [facility] 07/10/2016, 8:39 AM

## 2016-07-11 ENCOUNTER — Inpatient Hospital Stay (HOSPITAL_COMMUNITY): Payer: BLUE CROSS/BLUE SHIELD

## 2016-07-11 LAB — CBC
HEMATOCRIT: 23.7 % — AB (ref 39.0–52.0)
HEMOGLOBIN: 8 g/dL — AB (ref 13.0–17.0)
MCH: 26.2 pg (ref 26.0–34.0)
MCHC: 33.8 g/dL (ref 30.0–36.0)
MCV: 77.7 fL — ABNORMAL LOW (ref 78.0–100.0)
Platelets: 138 10*3/uL — ABNORMAL LOW (ref 150–400)
RBC: 3.05 MIL/uL — AB (ref 4.22–5.81)
RDW: 20 % — ABNORMAL HIGH (ref 11.5–15.5)
WBC: 26.1 10*3/uL — AB (ref 4.0–10.5)

## 2016-07-11 LAB — BASIC METABOLIC PANEL
ANION GAP: 9 (ref 5–15)
BUN: 29 mg/dL — AB (ref 6–20)
CHLORIDE: 108 mmol/L (ref 101–111)
CO2: 22 mmol/L (ref 22–32)
Calcium: 7.6 mg/dL — ABNORMAL LOW (ref 8.9–10.3)
Creatinine, Ser: 0.85 mg/dL (ref 0.61–1.24)
GFR calc non Af Amer: >60 mL/min (ref >60)
Glucose, Bld: 135 mg/dL — ABNORMAL HIGH (ref 65–99)
POTASSIUM: 3.2 mmol/L — AB (ref 3.5–5.1)
SODIUM: 139 mmol/L (ref 135–145)

## 2016-07-11 LAB — CULTURE, BLOOD (ROUTINE X 2)

## 2016-07-11 LAB — VANCOMYCIN, TROUGH: VANCOMYCIN TR: 22 ug/mL — AB (ref 15–20)

## 2016-07-11 MED ORDER — AMOXICILLIN-POT CLAVULANATE 875-125 MG PO TABS
1.0000 | ORAL_TABLET | Freq: Two times a day (BID) | ORAL | Status: DC
  Administered 2016-07-11: 1 via ORAL
  Filled 2016-07-11: qty 1

## 2016-07-11 MED ORDER — VANCOMYCIN HCL 500 MG IV SOLR
500.0000 mg | Freq: Two times a day (BID) | INTRAVENOUS | Status: DC
  Administered 2016-07-12 – 2016-07-13 (×3): 500 mg via INTRAVENOUS
  Filled 2016-07-11 (×4): qty 500

## 2016-07-11 MED ORDER — ONDANSETRON HCL 4 MG/2ML IJ SOLN
4.0000 mg | Freq: Four times a day (QID) | INTRAMUSCULAR | Status: DC
  Administered 2016-07-11 – 2016-07-17 (×24): 4 mg via INTRAVENOUS
  Filled 2016-07-11 (×24): qty 2

## 2016-07-11 MED ORDER — AMOXICILLIN-POT CLAVULANATE 400-57 MG/5ML PO SUSR
875.0000 mg | Freq: Two times a day (BID) | ORAL | Status: AC
  Administered 2016-07-12 – 2016-07-13 (×3): 875 mg via ORAL
  Filled 2016-07-11 (×7): qty 10.9

## 2016-07-11 MED ORDER — PROMETHAZINE HCL 25 MG/ML IJ SOLN
6.2500 mg | Freq: Four times a day (QID) | INTRAMUSCULAR | Status: DC | PRN
  Administered 2016-07-11 – 2016-07-12 (×2): 6.25 mg via INTRAVENOUS
  Filled 2016-07-11 (×5): qty 1

## 2016-07-11 NOTE — Progress Notes (Signed)
Pharmacy Antibiotic Note  Andre Fernandez is a 60 y.o. male admitted on 07/11/2016.  He was called to return to ED for positive blood cultures obtained at ED on 8/23.  PMH includes metastatic colon cancer (cycle 1 FOLFOX chemo on 07/06/16), DVT on Xarelto, sacral ulcer, and US paracentesis on 07/06/16.  Pharmacy has been consulted for Vancomycin and Cefepime dosing. Per notes, Md consulting with ID and plan is to continue vanc/cefepime until cultures final  SCr stable Afebrile WBC 26.1 rising   Plan:  Continue Cefepime 1g IV q8h  Continue Vancomycin 750 mg IV q12h.  Check vanc trough tonight (goal 15-20) prior to 6th dose  Follow up renal fxn, culture results, and clinical course.  Height: 6\' 1"  (185.4 cm) Weight: 145 lb (65.8 kg) IBW/kg (Calculated) : 79.9  Temp (24hrs), Avg:97.9 F (36.6 C), Min:97.7 F (36.5 C), Max:98.2 F (36.8 C)   Recent Labs Lab 07/06/16 0803 07/06/16 0804 07/07/16 0243 06/16/2016 0834 06/23/2016 0837 07/10/16 0555 07/11/16 0500  WBC 28.1*  --  30.2* 20.4*  --  13.9* 26.1*  CREATININE  --  0.8 1.08 0.92  --  0.83 0.85  LATICACIDVEN  --   --   --   --  2.74*  --   --     Estimated Creatinine Clearance: 86 mL/min (by C-G formula based on SCr of 0.85 mg/dL).    No Known Allergies  Antimicrobials this admission: 8/25 Cefepime >>  8/25 Vancomycin >>   Dose adjustments this admission:   Microbiology results: 8/23 (From previous ED visit) BCx: gram variable rod (aerobic bottle only) 8/23 BCID: NONE DETECTED 8/25 BCx: ngtd 8/25 urine: ngf   Thank you for allowing pharmacy to be a part of this patient's care.   Adrian Saran, PharmD, BCPS Pager [phone] 07/11/2016 9:42 AM

## 2016-07-11 NOTE — Progress Notes (Signed)
PHARMACY - VANCOMYCIN (brief note)  Patient on Vancomycin 750mg  IV q12h for bacteremia.  Cefepime d/c'ed today and changed to Augmentin.  Vancomycin trough level = 22 mcg/ml (15-20 mcg/ml)  Plan:  Will reduce Vancomycin to 500mg  IV q12h           Recheck vanc level when appropriate  Leone Haven, PharmD

## 2016-07-11 NOTE — Progress Notes (Signed)
CRITICAL VALUE ALERT  Critical value received:  vanc trough 22  Date of notification:  07/11/2016  Time of notification:  2131  Critical value read back:Yes.    Nurse who received alert:  J.Jacari Kirsten RN  MD notified (1st page):  K.Schorr  Time of first page:  2141  MD notified (2nd page):  Time of second page:  Responding MD:    Time MD responded:  On-call notified, will carry out any new orders

## 2016-07-11 NOTE — Evaluation (Signed)
Physical Therapy Evaluation Patient Details Name: Exander Waldera MRN: [id number] DOB: July 29, 1956 Today's Date: 07/11/2016   History of Present Illness  60 yo male admitted with bacteremia. Hx of lap colostomy 8/4, colon cancer with mets-on chemo, HTN, sacral decubitus, DVT. Recent d/c from hospital 8/12.   Clinical Impression  On eval, pt required Min guard-Min assist for mobility. He walked ~150 feet with a RW. Pt tolerated distance well. Discussed d/c plan-plan is for pt to return home with family assisting as needed. Will follow and progress activity as tolerated.     Follow Up Recommendations No PT follow up;Supervision/Assistance - 24 hour (family poliltely declined HHPT follow up at this time)    Equipment Recommendations       Recommendations for Other Services       Precautions / Restrictions Precautions Precautions: Fall Restrictions Weight Bearing Restrictions: No      Mobility  Bed Mobility Overal bed mobility: Needs Assistance Bed Mobility: Supine to Sit     Supine to sit: Min assist;HOB elevated     General bed mobility comments: small amount of assist for LEs. Increased time.   Transfers Overall transfer level: Needs assistance Equipment used: Rolling walker (2 wheeled) Transfers: Sit to/from Stand Sit to Stand: Min guard;From elevated surface         General transfer comment: close guard for safety. VCs safety, hand placement  Ambulation/Gait Ambulation/Gait assistance: Min guard Ambulation Distance (Feet): 150 Feet Assistive device: Rolling walker (2 wheeled) Gait Pattern/deviations: Step-through pattern;Decreased stride length     General Gait Details: slow gait speed. close guard for safety. Pt tolerated distance well.   Stairs            Wheelchair Mobility    Modified Rankin (Stroke Patients Only)       Balance Overall balance assessment: Needs assistance           Standing balance-Leahy Scale: Poor                                Pertinent Vitals/Pain Pain Assessment: No/denies pain    Home Living Family/patient expects to be discharged to:: Private residence Living Arrangements: Spouse/significant other Available Help at Discharge: Family Type of Home: House Home Access: Stairs to enter Entrance Stairs-Rails: None Entrance Stairs-Number of Steps: 2 Home Layout: Two level;Able to live on main level with bedroom/bathroom Home Equipment: Gilford Rile - 2 wheels;Wheelchair - manual;Bedside commode      Prior Function Level of Independence: Needs assistance   Gait / Transfers Assistance Needed: ambulatory with RW. Uses WC intermittently  ADL's / Homemaking Assistance Needed: family assists        Hand Dominance        Extremity/Trunk Assessment   Upper Extremity Assessment: Generalized weakness           Lower Extremity Assessment: Generalized weakness      Cervical / Trunk Assessment: Kyphotic  Communication   Communication: No difficulties  Cognition Arousal/Alertness: Awake/alert Behavior During Therapy: Flat affect Overall Cognitive Status: Within Functional Limits for tasks assessed                      General Comments      Exercises        Assessment/Plan    PT Assessment Patient needs continued PT services  PT Diagnosis Difficulty walking;Generalized weakness   PT Problem List Decreased strength;Decreased mobility;Decreased balance  PT Treatment Interventions DME instruction;Functional  mobility training;Therapeutic activities;Patient/family education;Gait training;Therapeutic exercise;Balance training   PT Goals (Current goals can be found in the Care Plan section) Acute Rehab PT Goals Patient Stated Goal: none stated PT Goal Formulation: With patient/family Time For Goal Achievement: 07/25/16 Potential to Achieve Goals: Good    Frequency Min 3X/week   Barriers to discharge        Co-evaluation               End of Session    Activity Tolerance: Patient tolerated treatment well Patient left: in chair;with call bell/phone within reach;with chair alarm set;with family/visitor present;with nursing/sitter in room           Time: [id number] PT Time Calculation (min) (ACUTE ONLY): 15 min   Charges:   PT Evaluation $PT Eval Low Complexity: 1 Procedure     PT G Codes:        Weston Anna, MPT Pager: [phone]

## 2016-07-11 NOTE — Progress Notes (Signed)
PROGRESS NOTE  Andre Fernandez [id number] DOB: June 26, 1956 DOA: 06/22/2016 PCP: Pcp Not In System   LOS: 2 days   Brief Narrative: 60 y.o. 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 [name] from oncology and has started chemotherapy with round 1 which was about 2 days prior to admission. Patient was seen in the ED on 8/24, sent home as his symptoms improved, however had blood cultures done. On 8/25 his blood cultures turned positive for gram variable rods and patient was directed to ER and admitted on 8/25.  Assessment & Plan: Active Problems:   Colon cancer metastasized to multiple sites [facility])   Ascites   Bacteremia   DVT (deep venous thrombosis) (HCC)   Leukocytosis (leucocytosis)   Colostomy in place [facility])   Severe malnutrition (HCC)   Decubitus ulcer   Liver metastasis (HCC)   Anemia   Bacillus bacteremia  - Patient's reflexes ID panel was negative, however regular blood cultures are positive for bacillus - I discussed with Dr. Johnnye Sima from infectious disease today, we'll treat this as being true bacteremia given underlying malignancy as well as immunosuppression, we'll evaluate the port with an ultrasound for abscesses/vegetations, if positive the port will need to be removed, if negative can attempt to treat through with 14 days of vancomycin - Discontinue cefepime - He did receive chemotherapy 2 days PTA, closely monitor patient's counts and whether he would become neutropenic  - repeat cultures obtained this admission remained negative  Leukocytosis - Patient has been having persistent leukocytosis since most recent hospitalization, was as high as 30 on 8/23, it is 20 on admission  Possible aspiration pneumonia - Patient actively vomiting in the ED, his oxygen saturation is in the  low 90s, chest x-ray with potential infiltrate, he was on Cefepime for 3 days - treat with Augmentin 2 additional days for 5 day course  Anemia - Likely related to chemotherapy, he has no bleeding in his ostomy bag, there is no melena, his fecal occult positive however without clinical active bleeding.  - Hemoglobin is overall stable  Stage IV colon cancer with liver metastasis - Care as per oncology as an outpatient - He does have elevated alkaline phosphatase related to his liver metastasis  Recent DVT - Xarelto per pharmacy  Stage II decubitus ulcer - Wound care  Severe malnutrition - Dietary consult - Albumin is low  Chronic cancer induced pain - Resume home medications  Hypertension - Given normal blood pressure, clinical dehydration as well as bacteremia, hold his home bisoprolol hydrochlorothiazide - Blood pressure this morning within normal parameters, can probably hold antihypertensives on discharge    DVT prophylaxis: On Xarelto Code Status: Full code Family Communication: Discussed with ex-wife at bedside Disposition Plan: Home once blood cultures speciated  Consultants:   None  Procedures:   None   Antimicrobials:  Vancomycin 8/25 >>  Cefepime 8/25 >> 8/27  Augmentin 8/27 >> plan to stop 8/29  Subjective: - ongoing nausea, trying hard to eat  Objective: Vitals:   07/10/16 0439 07/10/16 1500 07/10/16 2107 07/11/16 0516  BP: 110/73 116/78 118/78 119/81  Pulse: (!) 101 (!) 118 (!) 121 (!) 112  Resp: 16 16 17 16   Temp: 97.8 F (36.6 C) 98.2 F (36.8 C) 97.8 F (36.6 C) 97.7 F (36.5 C)  TempSrc: Oral Oral Oral Oral  SpO2: 97%  99% 97% 96%  Weight:      Height:        Intake/Output Summary (Last 24 hours) at 07/11/16 1022 Last data filed at 07/11/16 0600  Gross per 24 hour  Intake             2215 ml  Output              326 ml  Net             1889 ml   Filed Weights   06/30/2016 [id number]  Weight: 65.8 kg (145 lb)     Examination: Constitutional: NAD Vitals:   07/10/16 0439 07/10/16 1500 07/10/16 2107 07/11/16 0516  BP: 110/73 116/78 118/78 119/81  Pulse: (!) 101 (!) 118 (!) 121 (!) 112  Resp: 16 16 17 16   Temp: 97.8 F (36.6 C) 98.2 F (36.8 C) 97.8 F (36.6 C) 97.7 F (36.5 C)  TempSrc: Oral Oral Oral Oral  SpO2: 97% 99% 97% 96%  Weight:      Height:       Eyes: PERRL ENMT: Mucous membranes are dry. Respiratory: clear to auscultation bilaterally, no wheezing, no crackles.  Cardiovascular: Regular rate and rhythm, no murmurs / rubs / gallops. LLE edema, 1+ pitting. 2+ pedal pulses. Abdomen: no tenderness,Bowel sounds positive. Ostomy bag in place with brown stool  Musculoskeletal: Decreased muscle tone.  Neurologic: nonfocal     Data Reviewed: I have personally reviewed following labs and imaging studies  CBC:  Recent Labs Lab 07/06/16 0803 07/07/16 0243 07/04/2016 0834 07/10/16 0555 07/11/16 0500  WBC 28.1* 30.2* 20.4* 13.9* 26.1*  NEUTROABS 24.5*  --  19.2*  --   --   HGB 10.6* 10.8* 7.7* 8.0* 8.0*  HCT 31.7* 32.1* 22.6* 23.5* 23.7*  MCV 78.5* 78.9 77.9* 77.6* 77.7*  PLT 453* 395 240 208 0000000*   Basic Metabolic Panel:  Recent Labs Lab 07/06/16 0804 07/07/16 0243 07/11/2016 0830 06/25/2016 0834 07/10/16 0555 07/11/16 0500  NA 137 136  --  140 140 139  K 4.3 5.5*  --  3.1* 3.3* 3.2*  CL  --  98*  --  108 106 108  CO2 24 24  --  23 25 22   GLUCOSE 135 120*  --  105* 127* 135*  BUN 24.3 47*  --  48* 35* 29*  CREATININE 0.8 1.08  --  0.92 0.83 0.85  CALCIUM 10.9* 10.3  --  7.5* 7.7* 7.6*  MG  --   --  2.3  --   --   --    GFR: Estimated Creatinine Clearance: 86 mL/min (by C-G formula based on SCr of 0.85 mg/dL). Liver Function Tests:  Recent Labs Lab 07/06/16 0804 07/07/16 0243 06/16/2016 0834 07/10/16 0555  AST 104* 99* 53* 45*  ALT 52 47 35 33  ALKPHOS 2,718 Repeated and Verified* 1,885* 1,243* 1,151*  BILITOT 1.47* 1.5* 1.0 0.9  PROT 6.3* 6.7 5.0* 5.4*   ALBUMIN 1.8* 2.3* 1.7* 1.8*   No results for input(s): LIPASE, AMYLASE in the last 168 hours. No results for input(s): AMMONIA in the last 168 hours. Coagulation Profile:  Recent Labs Lab 07/05/2016 0831  INR 4.53*   Cardiac Enzymes: No results for input(s): CKTOTAL, CKMB, CKMBINDEX, TROPONINI in the last 168 hours. BNP (last 3 results) No results for input(s): PROBNP in the last 8760 hours. HbA1C: No results for input(s): HGBA1C in the last 72 hours. CBG: No results for input(s): GLUCAP in the last 168 hours. Lipid Profile:  No results for input(s): CHOL, HDL, LDLCALC, TRIG, CHOLHDL, LDLDIRECT in the last 72 hours. Thyroid Function Tests: No results for input(s): TSH, T4TOTAL, FREET4, T3FREE, THYROIDAB in the last 72 hours. Anemia Panel: No results for input(s): VITAMINB12, FOLATE, FERRITIN, TIBC, IRON, RETICCTPCT in the last 72 hours. Urine analysis:    Component Value Date/Time   COLORURINE YELLOW 06/21/2016 0730   APPEARANCEUR CLOUDY (A) 06/22/2016 0730   LABSPEC 1.028 07/13/2016 0730   PHURINE 5.0 07/08/2016 0730   GLUCOSEU NEGATIVE 06/23/2016 0730   HGBUR NEGATIVE 07/05/2016 0730   BILIRUBINUR NEGATIVE 07/11/2016 0730   KETONESUR NEGATIVE 06/24/2016 0730   PROTEINUR NEGATIVE 06/30/2016 0730   NITRITE NEGATIVE 06/18/2016 0730   LEUKOCYTESUR NEGATIVE 06/19/2016 0730   Sepsis Labs: Invalid input(s): PROCALCITONIN, LACTICIDVEN  Recent Results (from the past 240 hour(s))  TECHNOLOGIST REVIEW     Status: None   Collection Time: 07/06/16  8:03 AM  Result Value Ref Range Status   Technologist Review few ovalo & targets. few large plt.  Final  Culture, blood (routine x 2)     Status: Abnormal   Collection Time: 07/07/16  8:45 PM  Result Value Ref Range Status   Specimen Description BLOOD RIGHT ANTECUBITAL  Final   Special Requests BOTTLES DRAWN AEROBIC AND ANAEROBIC 5CC EACH  Final   Culture  Setup Time   Final    GRAM VARIABLE ROD AEROBIC BOTTLE ONLY CRITICAL  RESULT CALLED TO, READ BACK BY AND VERIFIED WITH: Jomarie Longs RN [id number] 06/24/2016 A BROWNING    Culture (A)  Final    BACILLUS SPECIES Standardized susceptibility testing for this organism is not available. Performed at [facility]    Report Status 07/11/2016 FINAL  Final  Blood Culture ID Panel (Reflexed)     Status: None   Collection Time: 07/07/16  8:45 PM  Result Value Ref Range Status   Enterococcus species NOT DETECTED NOT DETECTED Final   Vancomycin resistance NOT DETECTED NOT DETECTED Final   Listeria monocytogenes NOT DETECTED NOT DETECTED Final   Staphylococcus species NOT DETECTED NOT DETECTED Final   Staphylococcus aureus NOT DETECTED NOT DETECTED Final   Methicillin resistance NOT DETECTED NOT DETECTED Final   Streptococcus species NOT DETECTED NOT DETECTED Final   Streptococcus agalactiae NOT DETECTED NOT DETECTED Final   Streptococcus pneumoniae NOT DETECTED NOT DETECTED Final   Streptococcus pyogenes NOT DETECTED NOT DETECTED Final   Acinetobacter baumannii NOT DETECTED NOT DETECTED Final   Enterobacteriaceae species NOT DETECTED NOT DETECTED Final   Enterobacter cloacae complex NOT DETECTED NOT DETECTED Final   Escherichia coli NOT DETECTED NOT DETECTED Final   Klebsiella oxytoca NOT DETECTED NOT DETECTED Final   Klebsiella pneumoniae NOT DETECTED NOT DETECTED Final   Proteus species NOT DETECTED NOT DETECTED Final   Serratia marcescens NOT DETECTED NOT DETECTED Final   Carbapenem resistance NOT DETECTED NOT DETECTED Final   Haemophilus influenzae NOT DETECTED NOT DETECTED Final   Neisseria meningitidis NOT DETECTED NOT DETECTED Final   Pseudomonas aeruginosa NOT DETECTED NOT DETECTED Final   Candida albicans NOT DETECTED NOT DETECTED Final   Candida glabrata NOT DETECTED NOT DETECTED Final   Candida krusei NOT DETECTED NOT DETECTED Final   Candida parapsilosis NOT DETECTED NOT DETECTED Final   Candida tropicalis NOT DETECTED NOT DETECTED Final    Comment:  Performed at [facility]  Culture, blood (routine x 2)     Status: None (Preliminary result)   Collection Time: 07/07/16 11:56 PM  Result Value Ref  Range Status   Specimen Description BLOOD LEFT ANTECUBITAL  Final   Special Requests BOTTLES DRAWN AEROBIC AND ANAEROBIC 5CC  Final   Culture   Final    NO GROWTH 2 DAYS Performed at [facility]    Report Status PENDING  Incomplete  Urine culture     Status: None   Collection Time: 07/08/2016  7:30 AM  Result Value Ref Range Status   Specimen Description URINE, RANDOM  Final   Special Requests NONE  Final   Culture NO GROWTH Performed at [facility]   Final   Report Status 07/10/2016 FINAL  Final  Blood culture (routine x 2)     Status: None (Preliminary result)   Collection Time: 07/06/2016  8:31 AM  Result Value Ref Range Status   Specimen Description BLOOD CENTRAL LINE  Final   Special Requests BOTTLES DRAWN AEROBIC AND ANAEROBIC 5ML  Final   Culture   Final    NO GROWTH 1 DAY Performed at [facility]    Report Status PENDING  Incomplete  Blood culture (routine x 2)     Status: None (Preliminary result)   Collection Time: 07/07/2016  8:51 AM  Result Value Ref Range Status   Specimen Description BLOOD RIGHT ARM  Final   Special Requests BOTTLES DRAWN AEROBIC AND ANAEROBIC [place]  Final   Culture   Final    NO GROWTH 1 DAY Performed at [facility]    Report Status PENDING  Incomplete      Radiology Studies: [place] Chest  Result Date: 07/11/2016 CLINICAL DATA:  Evaluate Port-A-Cath 4 abscess. EXAM: CHEST ULTRASOUND COMPARISON:  Chest radiograph 07/07/2016 FINDINGS: No fluid collections surrounding the Port-A-Cath in the LEFT chest wall. No evidence of abscess or edema. IMPRESSION: No evidence of abscess at LEFT port chest wall site. Electronically Signed   By: Suzy Bouchard M.D.   On: 07/11/2016 09:57     Scheduled Meds: . feeding supplement (ENSURE ENLIVE)  237 mL Oral TID BM  .  latanoprost  1 drop Both Eyes QHS  . ondansetron (ZOFRAN) IV  4 mg Intravenous Q6H  . polyethylene glycol  17 g Oral Daily  . rivaroxaban  15 mg Oral BID   Followed by  . [START ON 07/23/2016] rivaroxaban  20 mg Oral Q supper  . sodium chloride flush  3 mL Intravenous Q12H  . vancomycin  750 mg Intravenous Q12H   Continuous Infusions: . dextrose 5 % and 0.45% NaCl 75 mL/hr at 07/11/16 0954     Marzetta Board, MD, PhD [facility] Pager [phone]  If 7PM-7AM, please contact night-coverage [email or web address] Password [facility] 07/11/2016, 10:22 AM

## 2016-07-12 ENCOUNTER — Telehealth (HOSPITAL_BASED_OUTPATIENT_CLINIC_OR_DEPARTMENT_OTHER): Payer: Self-pay | Admitting: Emergency Medicine

## 2016-07-12 ENCOUNTER — Inpatient Hospital Stay (HOSPITAL_COMMUNITY): Payer: BLUE CROSS/BLUE SHIELD

## 2016-07-12 LAB — CBC
HEMATOCRIT: 24.3 % — AB (ref 39.0–52.0)
Hemoglobin: 8.3 g/dL — ABNORMAL LOW (ref 13.0–17.0)
MCH: 26.5 pg (ref 26.0–34.0)
MCHC: 34.2 g/dL (ref 30.0–36.0)
MCV: 77.6 fL — AB (ref 78.0–100.0)
Platelets: 78 10*3/uL — ABNORMAL LOW (ref 150–400)
RBC: 3.13 MIL/uL — ABNORMAL LOW (ref 4.22–5.81)
RDW: 20.5 % — AB (ref 11.5–15.5)
WBC: 23.1 10*3/uL — ABNORMAL HIGH (ref 4.0–10.5)

## 2016-07-12 LAB — BASIC METABOLIC PANEL
Anion gap: 9 (ref 5–15)
BUN: 34 mg/dL — ABNORMAL HIGH (ref 6–20)
CALCIUM: 7.1 mg/dL — AB (ref 8.9–10.3)
CO2: 21 mmol/L — AB (ref 22–32)
CREATININE: 1.3 mg/dL — AB (ref 0.61–1.24)
Chloride: 107 mmol/L (ref 101–111)
GFR calc Af Amer: >60 mL/min (ref >60)
GFR calc non Af Amer: 58 mL/min — ABNORMAL LOW (ref >60)
GLUCOSE: 126 mg/dL — AB (ref 65–99)
Potassium: 3.2 mmol/L — ABNORMAL LOW (ref 3.5–5.1)
Sodium: 137 mmol/L (ref 135–145)

## 2016-07-12 MED ORDER — SODIUM CHLORIDE 0.9 % IV BOLUS (SEPSIS)
500.0000 mL | Freq: Once | INTRAVENOUS | Status: AC
  Administered 2016-07-12: 500 mL via INTRAVENOUS

## 2016-07-12 MED ORDER — SENNOSIDES-DOCUSATE SODIUM 8.6-50 MG PO TABS
1.0000 | ORAL_TABLET | Freq: Two times a day (BID) | ORAL | Status: DC
  Administered 2016-07-12 – 2016-07-13 (×3): 1 via ORAL
  Filled 2016-07-12: qty 1
  Filled 2016-07-12: qty 2
  Filled 2016-07-12: qty 1

## 2016-07-12 NOTE — Telephone Encounter (Signed)
Post ED Visit - Positive Culture Follow-up  Culture report reviewed by antimicrobial stewardship pharmacist:  []  Elenor Quinones, Pharm.D. []  Heide Guile, Pharm.D., BCPS [x]  Parks Neptune, Pharm.D. []  Alycia Rossetti, Pharm.D., BCPS []  [place], Pharm.D., BCPS, AAHIVP []  Legrand Como, Pharm.D., BCPS, AAHIVP []  Milus Glazier, Pharm.D. []  Stephens November, Pharm.D.  Positive blood culture Patient is an inpatient @ [address]  Hazle Nordmann 07/12/2016, 8:50 AM

## 2016-07-12 NOTE — Progress Notes (Signed)
Pt will discharge to [address] [place],  [address]

## 2016-07-12 NOTE — Progress Notes (Signed)
Pt will go home with [facility] for infusion and teaching. Marland Kitchen Referral given to in house rep.

## 2016-07-12 NOTE — Progress Notes (Signed)
PROGRESS NOTE  Andre Fernandez [id number] DOB: 1956-11-13 DOA: 07/05/2016 PCP: Pcp Not In System   LOS: 3 days   Brief Narrative: 60 y.o. 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 [name] from oncology and has started chemotherapy with round 1 which was about 2 days prior to admission. Patient was seen in the ED on 8/24, sent home as his symptoms improved, however had blood cultures done. On 8/25 his blood cultures turned positive for gram variable rods and patient was directed to ER and admitted on 8/25.  Assessment & Plan: Active Problems:   Colon cancer metastasized to multiple sites [facility])   Ascites   Bacteremia   DVT (deep venous thrombosis) (HCC)   Leukocytosis (leucocytosis)   Colostomy in place [facility])   Severe malnutrition (HCC)   Decubitus ulcer   Liver metastasis (HCC)   Anemia   Bacillus bacteremia  - Patient's reflexes ID panel was negative, however regular blood cultures are positive for bacillus - I discussed with Dr. Johnnye Sima from infectious disease 8/27, we'll treat this as being true bacteremia given underlying malignancy as well as immunosuppression,  - port [place] without abscess or evidence of local infection - Discontinued cefepime - He did receive chemotherapy 2 days PTA, closely monitor patient's counts and whether he would become neutropenic  - repeat cultures obtained this admission remained negative  - plan for 14 days of Vancomycin at home via port  Persistent nausea / abdominal discomfort / constipation - obtain plain film today  - multifactorial due to narcotics, recent chemo  Leukocytosis - Patient has been having persistent leukocytosis since most recent hospitalization, was as high as 30 on 8/23, it is 20 on admission - CBC pending this morning  Possible  aspiration pneumonia - Patient actively vomiting in the ED, his oxygen saturation is in the low 90s, chest x-ray with potential infiltrate, he was on Cefepime for 3 days - treat with Augmentin 2 additional days for 5 day course, today day 4/5  Anemia - Likely related to chemotherapy, he has no bleeding in his ostomy bag, there is no melena, his fecal occult positive however without clinical active bleeding.  - Hemoglobin is overall stable  Stage IV colon cancer with liver metastasis - Care as per oncology as an outpatient - He does have elevated alkaline phosphatase related to his liver metastasis  Recent DVT - Xarelto per pharmacy  Stage II decubitus ulcer - Wound care  Severe malnutrition - Dietary consult - Albumin is low  Chronic cancer induced pain - Resume home medications  Hypertension - Given normal blood pressure, clinical dehydration as well as bacteremia, hold his home bisoprolol hydrochlorothiazide - Blood pressure this morning within normal parameters, can probably hold antihypertensives on discharge    DVT prophylaxis: On Xarelto Code Status: Full code Family Communication: Discussed with ex-wife at bedside Disposition Plan: Home likely 1 day   Consultants:   None  Procedures:   None   Antimicrobials:  Vancomycin 8/25 >>  Cefepime 8/25 >> 8/27  Augmentin 8/27 >> plan to stop 8/29  Subjective: - ongoing nausea, no vomiting - constipated  Objective: Vitals:   07/11/16 0516 07/11/16 1455 07/11/16 2105 07/12/16 0550  BP: 119/81 107/87 119/82 117/90  Pulse: (!) 112 (!) 117 (!) 112 (!) 126  Resp: 16 18 14 16   Temp: 97.7 F (36.5  C) 98.2 F (36.8 C) 98.4 F (36.9 C) 97.6 F (36.4 C)  TempSrc: Oral Oral Axillary Oral  SpO2: 96% 99% 97% 97%  Weight:      Height:        Intake/Output Summary (Last 24 hours) at 07/12/16 0925 Last data filed at 07/12/16 0600  Gross per 24 hour  Intake             2770 ml  Output             1225  ml  Net             1545 ml   Filed Weights   06/26/2016 [id number]  Weight: 65.8 kg (145 lb)    Examination: Constitutional: NAD Vitals:   07/11/16 0516 07/11/16 1455 07/11/16 2105 07/12/16 0550  BP: 119/81 107/87 119/82 117/90  Pulse: (!) 112 (!) 117 (!) 112 (!) 126  Resp: 16 18 14 16   Temp: 97.7 F (36.5 C) 98.2 F (36.8 C) 98.4 F (36.9 C) 97.6 F (36.4 C)  TempSrc: Oral Oral Axillary Oral  SpO2: 96% 99% 97% 97%  Weight:      Height:       Eyes: PERRL ENMT: Mucous membranes are dry. Respiratory: clear to auscultation bilaterally, no wheezing, no crackles.  Cardiovascular: Regular rate and rhythm, no murmurs / rubs / gallops. LLE edema, 1+ pitting. 2+ pedal pulses. Abdomen: no tenderness,Bowel sounds positive. Ostomy bag in place with brown stool  Musculoskeletal: Decreased muscle tone.  Neurologic: nonfocal     Data Reviewed: I have personally reviewed following labs and imaging studies  CBC:  Recent Labs Lab 07/06/16 0803 07/07/16 0243 07/15/2016 0834 07/10/16 0555 07/11/16 0500  WBC 28.1* 30.2* 20.4* 13.9* 26.1*  NEUTROABS 24.5*  --  19.2*  --   --   HGB 10.6* 10.8* 7.7* 8.0* 8.0*  HCT 31.7* 32.1* 22.6* 23.5* 23.7*  MCV 78.5* 78.9 77.9* 77.6* 77.7*  PLT 453* 395 240 208 0000000*   Basic Metabolic Panel:  Recent Labs Lab 07/06/16 0804 07/07/16 0243 06/29/2016 0830 06/16/2016 0834 07/10/16 0555 07/11/16 0500  NA 137 136  --  140 140 139  K 4.3 5.5*  --  3.1* 3.3* 3.2*  CL  --  98*  --  108 106 108  CO2 24 24  --  23 25 22   GLUCOSE 135 120*  --  105* 127* 135*  BUN 24.3 47*  --  48* 35* 29*  CREATININE 0.8 1.08  --  0.92 0.83 0.85  CALCIUM 10.9* 10.3  --  7.5* 7.7* 7.6*  MG  --   --  2.3  --   --   --    GFR: Estimated Creatinine Clearance: 86 mL/min (by C-G formula based on SCr of 0.85 mg/dL). Liver Function Tests:  Recent Labs Lab 07/06/16 0804 07/07/16 0243 06/23/2016 0834 07/10/16 0555  AST 104* 99* 53* 45*  ALT 52 47 35 33  ALKPHOS 2,718  Repeated and Verified* 1,885* 1,243* 1,151*  BILITOT 1.47* 1.5* 1.0 0.9  PROT 6.3* 6.7 5.0* 5.4*  ALBUMIN 1.8* 2.3* 1.7* 1.8*   No results for input(s): LIPASE, AMYLASE in the last 168 hours. No results for input(s): AMMONIA in the last 168 hours. Coagulation Profile:  Recent Labs Lab 07/12/2016 0831  INR 4.53*   Cardiac Enzymes: No results for input(s): CKTOTAL, CKMB, CKMBINDEX, TROPONINI in the last 168 hours. BNP (last 3 results) No results for input(s): PROBNP in the last 8760 hours. HbA1C: No results  for input(s): HGBA1C in the last 72 hours. CBG: No results for input(s): GLUCAP in the last 168 hours. Lipid Profile: No results for input(s): CHOL, HDL, LDLCALC, TRIG, CHOLHDL, LDLDIRECT in the last 72 hours. Thyroid Function Tests: No results for input(s): TSH, T4TOTAL, FREET4, T3FREE, THYROIDAB in the last 72 hours. Anemia Panel: No results for input(s): VITAMINB12, FOLATE, FERRITIN, TIBC, IRON, RETICCTPCT in the last 72 hours. Urine analysis:    Component Value Date/Time   COLORURINE YELLOW 07/13/2016 0730   APPEARANCEUR CLOUDY (A) 07/07/2016 0730   LABSPEC 1.028 07/01/2016 0730   PHURINE 5.0 07/13/2016 0730   GLUCOSEU NEGATIVE 06/28/2016 0730   HGBUR NEGATIVE 06/22/2016 0730   BILIRUBINUR NEGATIVE 07/04/2016 0730   KETONESUR NEGATIVE 06/22/2016 0730   PROTEINUR NEGATIVE 07/01/2016 0730   NITRITE NEGATIVE 07/03/2016 0730   LEUKOCYTESUR NEGATIVE 06/19/2016 0730   Sepsis Labs: Invalid input(s): PROCALCITONIN, LACTICIDVEN  Recent Results (from the past 240 hour(s))  TECHNOLOGIST REVIEW     Status: None   Collection Time: 07/06/16  8:03 AM  Result Value Ref Range Status   Technologist Review few ovalo & targets. few large plt.  Final  Culture, blood (routine x 2)     Status: Abnormal   Collection Time: 07/07/16  8:45 PM  Result Value Ref Range Status   Specimen Description BLOOD RIGHT ANTECUBITAL  Final   Special Requests BOTTLES DRAWN AEROBIC AND ANAEROBIC 5CC  EACH  Final   Culture  Setup Time   Final    GRAM VARIABLE ROD AEROBIC BOTTLE ONLY CRITICAL RESULT CALLED TO, READ BACK BY AND VERIFIED WITH: Jomarie Longs RN [id number] 06/30/2016 A BROWNING    Culture (A)  Final    BACILLUS SPECIES Standardized susceptibility testing for this organism is not available. Performed at [facility]    Report Status 07/11/2016 FINAL  Final  Blood Culture ID Panel (Reflexed)     Status: None   Collection Time: 07/07/16  8:45 PM  Result Value Ref Range Status   Enterococcus species NOT DETECTED NOT DETECTED Final   Vancomycin resistance NOT DETECTED NOT DETECTED Final   Listeria monocytogenes NOT DETECTED NOT DETECTED Final   Staphylococcus species NOT DETECTED NOT DETECTED Final   Staphylococcus aureus NOT DETECTED NOT DETECTED Final   Methicillin resistance NOT DETECTED NOT DETECTED Final   Streptococcus species NOT DETECTED NOT DETECTED Final   Streptococcus agalactiae NOT DETECTED NOT DETECTED Final   Streptococcus pneumoniae NOT DETECTED NOT DETECTED Final   Streptococcus pyogenes NOT DETECTED NOT DETECTED Final   Acinetobacter baumannii NOT DETECTED NOT DETECTED Final   Enterobacteriaceae species NOT DETECTED NOT DETECTED Final   Enterobacter cloacae complex NOT DETECTED NOT DETECTED Final   Escherichia coli NOT DETECTED NOT DETECTED Final   Klebsiella oxytoca NOT DETECTED NOT DETECTED Final   Klebsiella pneumoniae NOT DETECTED NOT DETECTED Final   Proteus species NOT DETECTED NOT DETECTED Final   Serratia marcescens NOT DETECTED NOT DETECTED Final   Carbapenem resistance NOT DETECTED NOT DETECTED Final   Haemophilus influenzae NOT DETECTED NOT DETECTED Final   Neisseria meningitidis NOT DETECTED NOT DETECTED Final   Pseudomonas aeruginosa NOT DETECTED NOT DETECTED Final   Candida albicans NOT DETECTED NOT DETECTED Final   Candida glabrata NOT DETECTED NOT DETECTED Final   Candida krusei NOT DETECTED NOT DETECTED Final   Candida parapsilosis NOT  DETECTED NOT DETECTED Final   Candida tropicalis NOT DETECTED NOT DETECTED Final    Comment: Performed at [facility]  Culture, blood (routine  x 2)     Status: None (Preliminary result)   Collection Time: 07/07/16 11:56 PM  Result Value Ref Range Status   Specimen Description BLOOD LEFT ANTECUBITAL  Final   Special Requests BOTTLES DRAWN AEROBIC AND ANAEROBIC 5CC  Final   Culture   Final    NO GROWTH 3 DAYS Performed at [facility]    Report Status PENDING  Incomplete  Urine culture     Status: None   Collection Time: 07/03/2016  7:30 AM  Result Value Ref Range Status   Specimen Description URINE, RANDOM  Final   Special Requests NONE  Final   Culture NO GROWTH Performed at [facility]   Final   Report Status 07/10/2016 FINAL  Final  Blood culture (routine x 2)     Status: None (Preliminary result)   Collection Time: 07/06/2016  8:31 AM  Result Value Ref Range Status   Specimen Description BLOOD CENTRAL LINE  Final   Special Requests BOTTLES DRAWN AEROBIC AND ANAEROBIC 5ML  Final   Culture   Final    NO GROWTH 2 DAYS Performed at [facility]    Report Status PENDING  Incomplete  Blood culture (routine x 2)     Status: None (Preliminary result)   Collection Time: 07/12/2016  8:51 AM  Result Value Ref Range Status   Specimen Description BLOOD RIGHT ARM  Final   Special Requests BOTTLES DRAWN AEROBIC AND ANAEROBIC [place]  Final   Culture   Final    NO GROWTH 2 DAYS Performed at [facility]    Report Status PENDING  Incomplete      Radiology Studies: [place] Chest  Result Date: 07/11/2016 CLINICAL DATA:  Evaluate Port-A-Cath 4 abscess. EXAM: CHEST ULTRASOUND COMPARISON:  Chest radiograph 07/10/2016 FINDINGS: No fluid collections surrounding the Port-A-Cath in the LEFT chest wall. No evidence of abscess or edema. IMPRESSION: No evidence of abscess at LEFT port chest wall site. Electronically Signed   By: Suzy Bouchard M.D.   On: 07/11/2016 09:57      Scheduled Meds: . amoxicillin-clavulanate  875 mg Oral Q12H  . feeding supplement (ENSURE ENLIVE)  237 mL Oral TID BM  . latanoprost  1 drop Both Eyes QHS  . ondansetron (ZOFRAN) IV  4 mg Intravenous Q6H  . polyethylene glycol  17 g Oral Daily  . rivaroxaban  15 mg Oral BID   Followed by  . [START ON 07/23/2016] rivaroxaban  20 mg Oral Q supper  . sodium chloride flush  3 mL Intravenous Q12H  . vancomycin  500 mg Intravenous Q12H   Continuous Infusions: . dextrose 5 % and 0.45% NaCl 75 mL/hr at 07/12/16 Prosser, MD, PhD [facility] Pager [phone]  If 7PM-7AM, please contact night-coverage [email or web address] Password [facility] 07/12/2016, 9:25 AM

## 2016-07-12 NOTE — Progress Notes (Signed)
Physical Therapy Treatment Patient Details Name: Kwmane Braden MRN: [id number] DOB: [birth year] Today's Date: 07/12/2016    History of Present Illness 60 yo male admitted with bacteremia. Hx of lap colostomy 8/4, colon cancer with mets-on chemo, HTN, sacral decubitus, DVT. Recent d/c from hospital 8/12.     PT Comments    Pt continuing to participate well with therapy. Will continue to follow.  Follow Up Recommendations  No PT follow up;Supervision/Assistance - 24 hour     Equipment Recommendations       Recommendations for Other Services       Precautions / Restrictions Precautions Precautions: Fall Restrictions Weight Bearing Restrictions: No    Mobility  Bed Mobility Overal bed mobility: Needs Assistance Bed Mobility: Supine to Sit;Sit to Supine     Supine to sit: Min assist Sit to supine: Min assist   General bed mobility comments: small amount of assist for LEs. Increased time.   Transfers Overall transfer level: Needs assistance Equipment used: Rolling walker (2 wheeled) Transfers: Sit to/from Stand Sit to Stand: Min guard         General transfer comment: close guard for safety. VCs safety, hand placement  Ambulation/Gait Ambulation/Gait assistance: Min guard Ambulation Distance (Feet): 260 Feet Assistive device: Rolling walker (2 wheeled) Gait Pattern/deviations: Step-through pattern;Decreased stride length     General Gait Details: slow gait speed. close guard for safety. Pt tolerated distance well.    Stairs            Wheelchair Mobility    Modified Rankin (Stroke Patients Only)       Balance                                    Cognition Arousal/Alertness: Awake/alert Behavior During Therapy: Flat affect Overall Cognitive Status: Within Functional Limits for tasks assessed                      Exercises      General Comments        Pertinent Vitals/Pain Pain Assessment: No/denies pain    Home  Living                      Prior Function            PT Goals (current goals can now be found in the care plan section) Progress towards PT goals: Progressing toward goals    Frequency  Min 3X/week    PT Plan Current plan remains appropriate    Co-evaluation             End of Session   Activity Tolerance: Patient tolerated treatment well Patient left: in bed;with call bell/phone within reach;with bed alarm set     Time: 1050-1101 PT Time Calculation (min) (ACUTE ONLY): 11 min  Charges:  $Gait Training: 8-22 mins                    G Codes:      Weston Anna, MPT Pager: [phone]

## 2016-07-13 ENCOUNTER — Inpatient Hospital Stay (HOSPITAL_COMMUNITY): Payer: BLUE CROSS/BLUE SHIELD

## 2016-07-13 DIAGNOSIS — N179 Acute kidney failure, unspecified: Secondary | ICD-10-CM

## 2016-07-13 LAB — BASIC METABOLIC PANEL
ANION GAP: 8 (ref 5–15)
BUN: 41 mg/dL — ABNORMAL HIGH (ref 6–20)
CO2: 24 mmol/L (ref 22–32)
Calcium: 7.3 mg/dL — ABNORMAL LOW (ref 8.9–10.3)
Chloride: 108 mmol/L (ref 101–111)
Creatinine, Ser: 1.51 mg/dL — ABNORMAL HIGH (ref 0.61–1.24)
GFR calc Af Amer: 56 mL/min — ABNORMAL LOW (ref >60)
GFR, EST NON AFRICAN AMERICAN: 49 mL/min — AB (ref >60)
GLUCOSE: 122 mg/dL — AB (ref 65–99)
POTASSIUM: 3.1 mmol/L — AB (ref 3.5–5.1)
Sodium: 140 mmol/L (ref 135–145)

## 2016-07-13 LAB — CULTURE, BLOOD (ROUTINE X 2): Culture: NO GROWTH

## 2016-07-13 LAB — VANCOMYCIN, TROUGH: Vancomycin Tr: 28 ug/mL (ref 15–20)

## 2016-07-13 LAB — GLUCOSE, CAPILLARY: Glucose-Capillary: 122 mg/dL — ABNORMAL HIGH (ref 65–99)

## 2016-07-13 MED ORDER — FUROSEMIDE 10 MG/ML IJ SOLN
40.0000 mg | Freq: Once | INTRAMUSCULAR | Status: AC
  Administered 2016-07-13: 40 mg via INTRAVENOUS
  Filled 2016-07-13: qty 4

## 2016-07-13 MED ORDER — ALBUTEROL SULFATE (2.5 MG/3ML) 0.083% IN NEBU
2.5000 mg | INHALATION_SOLUTION | Freq: Four times a day (QID) | RESPIRATORY_TRACT | Status: DC
  Administered 2016-07-14 – 2016-07-16 (×10): 2.5 mg via RESPIRATORY_TRACT
  Filled 2016-07-13 (×10): qty 3

## 2016-07-13 MED ORDER — LEVALBUTEROL HCL 1.25 MG/0.5ML IN NEBU
1.2500 mg | INHALATION_SOLUTION | Freq: Once | RESPIRATORY_TRACT | Status: AC
  Administered 2016-07-13: 1.25 mg via RESPIRATORY_TRACT
  Filled 2016-07-13: qty 0.5

## 2016-07-13 NOTE — Progress Notes (Signed)
PT Cancellation Note  Patient Details Name: Andre Fernandez MRN: [id number] DOB: [birth year]   Cancelled Treatment:    Reason Eval/Treat Not Completed: 2nd attempt for PT tx session today. Pt declined participation due to not feeling well-nauseous, vomiting, "feeling weaker". Pt politely requested if PT can check back another time. Will check back tomorrow.    Weston Anna, MPT Pager: [phone]

## 2016-07-13 NOTE — Progress Notes (Signed)
PROGRESS NOTE  Andre Fernandez [id number] DOB: December 26, 1955 DOA: 06/25/2016 PCP: Pcp Not In System   LOS: 4 days   Brief Narrative: 60 y.o. 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 [name] from oncology and has started chemotherapy with round 1 which was about 2 days prior to admission. Patient was seen in the ED on 8/24, sent home as his symptoms improved, however had blood cultures done. On 8/25 his blood cultures turned positive for gram variable rods and patient was directed to ER and admitted on 8/25.  Interim summary Patient admitted to the hospital with bacteremia, started on Vancomycin and Cefepime. Cultures speciated Bacillus which is thought to be a real infection given malignancy and immunosuppression. He was set for d/c on 8/29 however his renal function started to worsen. Discussed with ID, Vancomycin is the only feasible option for this type of infection. Home pending stable renal function  Assessment & Plan: Active Problems:   Colon cancer metastasized to multiple sites [facility])   Ascites   Bacteremia   DVT (deep venous thrombosis) (HCC)   Leukocytosis (leucocytosis)   Colostomy in place [facility])   Severe malnutrition (HCC)   Decubitus ulcer   Liver metastasis (HCC)   Anemia   Bacillus bacteremia  - Patient's reflexes ID panel was negative, however regular blood cultures are positive for bacillus - I discussed with Dr. Johnnye Sima from infectious disease 8/27, we'll treat this as being true bacteremia given underlying malignancy as well as immunosuppression,  - port [place] without abscess or evidence of local infection - Discontinued cefepime - He did receive chemotherapy 2 days PTA, closely monitor patient's counts and whether he would become neutropenic  - repeat cultures obtained this  admission remained negative - plan for 14 days of Vancomycin at home via port, today day 5/14  AKI - patient with prior normal renal function, Cr yesterday 1.3 and today @ 1.5.  - likely multifactorial due to contrast (underwent a CT angio 8/16 and CT abdomen pelvis with contrast 8/23) as well as potentially Vancomycin. Discussed with Dr. Baxter Flattery from ID, no other good options for Bacillus treatment.  - place on strict I&Os, continue IVF  Persistent nausea / abdominal discomfort / constipation - multifactorial due to narcotics, recent chemo   Leukocytosis - Patient has been having persistent leukocytosis since most recent hospitalization, was as high as 30 on 8/23, it is 20 on admission  Thrombocytopenia - due to chemotherapy, no bleeding, closely monitor  Possible aspiration pneumonia - Patient actively vomiting in the ED, his oxygen saturation is in the low 90s, chest x-ray with potential infiltrate, he was on Cefepime for 3 days - treat with Augmentin 2 additional days for 5 day course, today day 5/5  Anemia - Likely related to chemotherapy, he has no bleeding in his ostomy bag, there is no melena, his fecal occult positive however without clinical active bleeding.  - Hemoglobin is overall stable  Stage IV colon cancer with liver metastasis - Care as per oncology as an outpatient - He does have elevated alkaline phosphatase related to his liver metastasis  Recent Acute DVT - Xarelto per pharmacy  Stage II decubitus ulcer - Wound care  Severe malnutrition - Dietary consult - Albumin is low  Chronic cancer induced pain - Resume home medications  Hypertension - Given normal blood pressure, clinical dehydration  as well as bacteremia, hold his home bisoprolol hydrochlorothiazide - Blood pressure this morning within normal parameters, can probably hold antihypertensives on discharge    DVT prophylaxis: On Xarelto Code Status: Full code Family Communication:  Discussed with ex-wife at bedside Disposition Plan: Home when renal function stable  Consultants:   None  Procedures:   None   Antimicrobials:  Vancomycin 8/25 >>  Cefepime 8/25 >> 8/27  Augmentin 8/27 >> plan to stop 8/29  Subjective: - ongoing nausea, no vomiting  Objective: Vitals:   07/11/16 2105 07/12/16 0550 07/12/16 2216 07/13/16 0504  BP: 119/82 117/90 118/85 124/89  Pulse: (!) 112 (!) 126 (!) 128 (!) 118  Resp: 14 16 18 18   Temp: 98.4 F (36.9 C) 97.6 F (36.4 C) 98.2 F (36.8 C) 97.8 F (36.6 C)  TempSrc: Axillary Oral Oral Oral  SpO2: 97% 97% 97% 96%  Weight:      Height:        Intake/Output Summary (Last 24 hours) at 07/13/16 0931 Last data filed at 07/13/16 0600  Gross per 24 hour  Intake             2320 ml  Output              675 ml  Net             1645 ml   Filed Weights   06/24/2016 [id number]  Weight: 65.8 kg (145 lb)    Examination: Constitutional: NAD Vitals:   07/11/16 2105 07/12/16 0550 07/12/16 2216 07/13/16 0504  BP: 119/82 117/90 118/85 124/89  Pulse: (!) 112 (!) 126 (!) 128 (!) 118  Resp: 14 16 18 18   Temp: 98.4 F (36.9 C) 97.6 F (36.4 C) 98.2 F (36.8 C) 97.8 F (36.6 C)  TempSrc: Axillary Oral Oral Oral  SpO2: 97% 97% 97% 96%  Weight:      Height:       Eyes: PERRL ENMT: Mucous membranes are dry. Respiratory: clear to auscultation bilaterally, no wheezing, no crackles.  Cardiovascular: Regular rate and rhythm, no murmurs / rubs / gallops. LLE edema, 1+ pitting. 2+ pedal pulses. Abdomen: no tenderness,Bowel sounds positive. Ostomy bag in place with brown stool  Musculoskeletal: Decreased muscle tone.  Neurologic: nonfocal     Data Reviewed: I have personally reviewed following labs and imaging studies  CBC:  Recent Labs Lab 07/07/16 0243 06/30/2016 0834 07/10/16 0555 07/11/16 0500 07/12/16 1030  WBC 30.2* 20.4* 13.9* 26.1* 23.1*  NEUTROABS  --  19.2*  --   --   --   HGB 10.8* 7.7* 8.0* 8.0* 8.3*  HCT  32.1* 22.6* 23.5* 23.7* 24.3*  MCV 78.9 77.9* 77.6* 77.7* 77.6*  PLT 395 240 208 138* 78*   Basic Metabolic Panel:  Recent Labs Lab 06/26/2016 0830 07/03/2016 0834 07/10/16 0555 07/11/16 0500 07/12/16 1030 07/13/16 0827  NA  --  140 140 139 137 140  K  --  3.1* 3.3* 3.2* 3.2* 3.1*  CL  --  108 106 108 107 108  CO2  --  23 25 22  21* 24  GLUCOSE  --  105* 127* 135* 126* 122*  BUN  --  48* 35* 29* 34* 41*  CREATININE  --  0.92 0.83 0.85 1.30* 1.51*  CALCIUM  --  7.5* 7.7* 7.6* 7.1* 7.3*  MG 2.3  --   --   --   --   --    GFR: Estimated Creatinine Clearance: 48.4 mL/min (by C-G formula based on  SCr of 1.51 mg/dL). Liver Function Tests:  Recent Labs Lab 07/07/16 0243 06/25/2016 0834 07/10/16 0555  AST 99* 53* 45*  ALT 47 35 33  ALKPHOS 1,885* 1,243* 1,151*  BILITOT 1.5* 1.0 0.9  PROT 6.7 5.0* 5.4*  ALBUMIN 2.3* 1.7* 1.8*   No results for input(s): LIPASE, AMYLASE in the last 168 hours. No results for input(s): AMMONIA in the last 168 hours. Coagulation Profile:  Recent Labs Lab 06/25/2016 0831  INR 4.53*   Cardiac Enzymes: No results for input(s): CKTOTAL, CKMB, CKMBINDEX, TROPONINI in the last 168 hours. BNP (last 3 results) No results for input(s): PROBNP in the last 8760 hours. HbA1C: No results for input(s): HGBA1C in the last 72 hours. CBG:  Recent Labs Lab 07/13/16 0740  GLUCAP 122*   Lipid Profile: No results for input(s): CHOL, HDL, LDLCALC, TRIG, CHOLHDL, LDLDIRECT in the last 72 hours. Thyroid Function Tests: No results for input(s): TSH, T4TOTAL, FREET4, T3FREE, THYROIDAB in the last 72 hours. Anemia Panel: No results for input(s): VITAMINB12, FOLATE, FERRITIN, TIBC, IRON, RETICCTPCT in the last 72 hours. Urine analysis:    Component Value Date/Time   COLORURINE YELLOW 07/13/2016 0730   APPEARANCEUR CLOUDY (A) 07/15/2016 0730   LABSPEC 1.028 06/23/2016 0730   PHURINE 5.0 07/08/2016 0730   GLUCOSEU NEGATIVE 06/22/2016 0730   HGBUR NEGATIVE  06/21/2016 0730   BILIRUBINUR NEGATIVE 07/01/2016 0730   KETONESUR NEGATIVE 06/15/2016 0730   PROTEINUR NEGATIVE 07/02/2016 0730   NITRITE NEGATIVE 06/20/2016 0730   LEUKOCYTESUR NEGATIVE 07/08/2016 0730   Sepsis Labs: Invalid input(s): PROCALCITONIN, LACTICIDVEN  Recent Results (from the past 240 hour(s))  TECHNOLOGIST REVIEW     Status: None   Collection Time: 07/06/16  8:03 AM  Result Value Ref Range Status   Technologist Review few ovalo & targets. few large plt.  Final  Culture, blood (routine x 2)     Status: Abnormal   Collection Time: 07/07/16  8:45 PM  Result Value Ref Range Status   Specimen Description BLOOD RIGHT ANTECUBITAL  Final   Special Requests BOTTLES DRAWN AEROBIC AND ANAEROBIC 5CC EACH  Final   Culture  Setup Time   Final    GRAM VARIABLE ROD AEROBIC BOTTLE ONLY CRITICAL RESULT CALLED TO, READ BACK BY AND VERIFIED WITH: Jomarie Longs RN [id number] 07/15/2016 A BROWNING    Culture (A)  Final    BACILLUS SPECIES Standardized susceptibility testing for this organism is not available. Performed at [facility]    Report Status 07/11/2016 FINAL  Final  Blood Culture ID Panel (Reflexed)     Status: None   Collection Time: 07/07/16  8:45 PM  Result Value Ref Range Status   Enterococcus species NOT DETECTED NOT DETECTED Final   Vancomycin resistance NOT DETECTED NOT DETECTED Final   Listeria monocytogenes NOT DETECTED NOT DETECTED Final   Staphylococcus species NOT DETECTED NOT DETECTED Final   Staphylococcus aureus NOT DETECTED NOT DETECTED Final   Methicillin resistance NOT DETECTED NOT DETECTED Final   Streptococcus species NOT DETECTED NOT DETECTED Final   Streptococcus agalactiae NOT DETECTED NOT DETECTED Final   Streptococcus pneumoniae NOT DETECTED NOT DETECTED Final   Streptococcus pyogenes NOT DETECTED NOT DETECTED Final   Acinetobacter baumannii NOT DETECTED NOT DETECTED Final   Enterobacteriaceae species NOT DETECTED NOT DETECTED Final   Enterobacter  cloacae complex NOT DETECTED NOT DETECTED Final   Escherichia coli NOT DETECTED NOT DETECTED Final   Klebsiella oxytoca NOT DETECTED NOT DETECTED Final   Klebsiella pneumoniae NOT  DETECTED NOT DETECTED Final   Proteus species NOT DETECTED NOT DETECTED Final   Serratia marcescens NOT DETECTED NOT DETECTED Final   Carbapenem resistance NOT DETECTED NOT DETECTED Final   Haemophilus influenzae NOT DETECTED NOT DETECTED Final   Neisseria meningitidis NOT DETECTED NOT DETECTED Final   Pseudomonas aeruginosa NOT DETECTED NOT DETECTED Final   Candida albicans NOT DETECTED NOT DETECTED Final   Candida glabrata NOT DETECTED NOT DETECTED Final   Candida krusei NOT DETECTED NOT DETECTED Final   Candida parapsilosis NOT DETECTED NOT DETECTED Final   Candida tropicalis NOT DETECTED NOT DETECTED Final    Comment: Performed at [facility]  Culture, blood (routine x 2)     Status: None (Preliminary result)   Collection Time: 07/07/16 11:56 PM  Result Value Ref Range Status   Specimen Description BLOOD LEFT ANTECUBITAL  Final   Special Requests BOTTLES DRAWN AEROBIC AND ANAEROBIC 5CC  Final   Culture   Final    NO GROWTH 3 DAYS Performed at [facility]    Report Status PENDING  Incomplete  Urine culture     Status: None   Collection Time: 06/20/2016  7:30 AM  Result Value Ref Range Status   Specimen Description URINE, RANDOM  Final   Special Requests NONE  Final   Culture NO GROWTH Performed at [facility]   Final   Report Status 07/10/2016 FINAL  Final  Blood culture (routine x 2)     Status: None (Preliminary result)   Collection Time: 07/04/2016  8:31 AM  Result Value Ref Range Status   Specimen Description BLOOD CENTRAL LINE  Final   Special Requests BOTTLES DRAWN AEROBIC AND ANAEROBIC 5ML  Final   Culture   Final    NO GROWTH 2 DAYS Performed at [facility]    Report Status PENDING  Incomplete  Blood culture (routine x 2)     Status: None (Preliminary  result)   Collection Time: 07/08/2016  8:51 AM  Result Value Ref Range Status   Specimen Description BLOOD RIGHT ARM  Final   Special Requests BOTTLES DRAWN AEROBIC AND ANAEROBIC [place]  Final   Culture   Final    NO GROWTH 2 DAYS Performed at [facility]    Report Status PENDING  Incomplete      Radiology Studies: [place] Chest  Result Date: 07/11/2016 CLINICAL DATA:  Evaluate Port-A-Cath 4 abscess. EXAM: CHEST ULTRASOUND COMPARISON:  Chest radiograph 06/29/2016 FINDINGS: No fluid collections surrounding the Port-A-Cath in the LEFT chest wall. No evidence of abscess or edema. IMPRESSION: No evidence of abscess at LEFT port chest wall site. Electronically Signed   By: Suzy Bouchard M.D.   On: 07/11/2016 09:57   Dg Abd 2 Views  Result Date: 07/12/2016 CLINICAL DATA:  Nausea vomiting abdominal pain EXAM: ABDOMEN - 2 VIEW COMPARISON:  07/07/2016 FINDINGS: Left lower quadrant colostomy is identified. Dilated loops of small bowel with multiple air-fluid levels are identified. The degree of bowel dilatation is slightly improved from previous exam IMPRESSION: 1. Mild improvement in small bowel dilatation compared with 07/07/2016 Electronically Signed   By: Kerby Moors M.D.   On: 07/12/2016 10:12     Scheduled Meds: . amoxicillin-clavulanate  875 mg Oral Q12H  . feeding supplement (ENSURE ENLIVE)  237 mL Oral TID BM  . latanoprost  1 drop Both Eyes QHS  . ondansetron (ZOFRAN) IV  4 mg Intravenous Q6H  . polyethylene glycol  17 g Oral Daily  .  rivaroxaban  15 mg Oral BID   Followed by  . [START ON 07/23/2016] rivaroxaban  20 mg Oral Q supper  . senna-docusate  1-2 tablet Oral BID  . sodium chloride flush  3 mL Intravenous Q12H   Continuous Infusions: . dextrose 5 % and 0.45% NaCl 75 mL/hr at 07/13/16 0733     Marzetta Board, MD, PhD [facility] Pager [phone]  If 7PM-7AM, please contact night-coverage [email or web address] Password [facility] 07/13/2016, 9:31 AM

## 2016-07-13 NOTE — Progress Notes (Addendum)
Patient c/o SOB. Vitals : 98.44F;HR 132;RR 30:103 /83;SATs 90% 2 L [place]. Patient has Coarse cracklesi bilaterally. PCP on call was notified.Marland Kitchen

## 2016-07-13 NOTE — Progress Notes (Signed)
PHARMACY - VANCOMYCIN (brief note)  See full note written earlier by Dia Sitter, PharmD  Vancomycin Trough level resulted = 28 mcg/ml (Goal 15-20).  Noted decline in Scr over past 2 days with decreased UOP.  Vancomycin regimen was adjusted on 8/28 to 500mg  IV q12h due to slightly elevated trough level of 22 mcg/ml.  Last dose of Vancomycin 500mg  IV given today @ 03:59.  Plan:   Continue to hold IV Vancomycin Will check random Vancomycin level with AM labs on 8/30 Plan to reorder Vancomycin once levels fall within desired range.  Leone Haven, PharmD

## 2016-07-13 NOTE — Progress Notes (Signed)
Pt BP 118/86, HR 125, oxygen saturations 97% on RA. MD notified. Will continue to monitor.

## 2016-07-13 NOTE — Progress Notes (Addendum)
Pharmacy Antibiotic Note  Andre Fernandez is a 60 y.o. male admitted on 07/02/2016.  He was called to return to ED for positive blood cultures obtained at ED on 8/23.  PMH includes metastatic colon cancer (cycle 1 FOLFOX chemo on 07/06/16), DVT on Xarelto, sacral ulcer, and US paracentesis on 07/06/16. Patient's currently on vancomycin for bacteremia (plan to treat for 2 weeks) and augmentin for suspected aspiration PNA.  Today, 07/13/2016: - day #5/14 for vancomycin - afebrile;  WBC elevated but down - scr up 1.51 (crcl~48), UOP down 0.4 ml/kg/hr  Plan: - with rising scr, will recheck vancomycin level with dose this afternoon to assess drug clearance - d/c standing vancomycin order until we are able to assess level this afternoon - augmentin 875 mg q12h per MD   Patient's home regimen of xarelto resumed for treatment of recent DVT.  Continue current regimen for now.  Platelets decreased from 138 to 78 today. Hgb stable.  No bleeding documented. Will monitor renal function and plt closely. ________________________________  Height: 6\' 1"  (185.4 cm) Weight: 145 lb (65.8 kg) IBW/kg (Calculated) : 79.9  Temp (24hrs), Avg:98 F (36.7 C), Min:97.8 F (36.6 C), Max:98.2 F (36.8 C)   Recent Labs Lab 07/07/16 0243 06/25/2016 0834 07/07/2016 0837 07/10/16 0555 07/11/16 0500 07/11/16 2100 07/12/16 1030  WBC 30.2* 20.4*  --  13.9* 26.1*  --  23.1*  CREATININE 1.08 0.92  --  0.83 0.85  --  1.30*  LATICACIDVEN  --   --  2.74*  --   --   --   --   VANCOTROUGH  --   --   --   --   --  22*  --     Estimated Creatinine Clearance: 56.2 mL/min (by C-G formula based on SCr of 1.3 mg/dL).    No Known Allergies  Antimicrobials this admission: 8/25 Cefepime >> 8/27 8/25 Vancomycin >>  8/27 Augmentin >>8/29  Dose adjustments this admission: 8/27: Vanc Trough at 2100 = 22 on 750mg  q12 : change to 500mg  IV q12h  Microbiology results: 8/23 (From previous ED visit) BCx: gram variable rod, bacillus  species (aerobic bottle only) 8/23 BCID: NONE DETECTED 8/25 BCx x2: ngtd 8/25 urine: neg FINAL   Thank you for allowing pharmacy to be a part of this patient's care.   Dia Sitter, PharmD, BCPS 07/13/2016 8:35 AM

## 2016-07-13 NOTE — Progress Notes (Signed)
Patient is c/o SOB,"drowning feeling". Vitals were : 39f;hr 127;rr 32;93/68;satS 93 % on 4 L [place]. Coarse crackles bilaterally. PCP was notified.

## 2016-07-13 NOTE — Progress Notes (Signed)
The X-Ray results were texted to the PCP on call.

## 2016-07-13 NOTE — Progress Notes (Signed)
[facility]  Patient Status:   New pt for [facility] this hospital admission  [facility] is providing the following services: HHRN and Home Infusion Pharmacy for home IV ABX.  Hospital infusion coordinator will provide in hospital teaching with pt/family regarding IV ABX administration to support independence at home.  [facility] hospital team will follow pt until DC to support transition home.   If patient discharges after hours, please call [phone].   Larry Sierras 07/13/2016, 10:17 PM

## 2016-07-13 NOTE — Progress Notes (Signed)
PT Cancellation Note  Patient Details Name: Andre Fernandez MRN: [id number] DOB: [birth year]   Cancelled Treatment:    Reason Eval/Treat Not Completed: Attempted PT tx session. Pt attempted to participate but he didn't feel well enough to walk at this time. Pt c/o dyspnea. BP: 112/92, HR 138 bpm, O2 97% RA.  Will check back as schedule allows.    Weston Anna, MPT Pager: [phone]

## 2016-07-14 ENCOUNTER — Inpatient Hospital Stay (HOSPITAL_COMMUNITY): Payer: BLUE CROSS/BLUE SHIELD

## 2016-07-14 DIAGNOSIS — R5081 Fever presenting with conditions classified elsewhere: Secondary | ICD-10-CM

## 2016-07-14 DIAGNOSIS — R7881 Bacteremia: Secondary | ICD-10-CM

## 2016-07-14 DIAGNOSIS — J9690 Respiratory failure, unspecified, unspecified whether with hypoxia or hypercapnia: Secondary | ICD-10-CM

## 2016-07-14 DIAGNOSIS — D696 Thrombocytopenia, unspecified: Secondary | ICD-10-CM

## 2016-07-14 DIAGNOSIS — J9601 Acute respiratory failure with hypoxia: Secondary | ICD-10-CM

## 2016-07-14 DIAGNOSIS — C20 Malignant neoplasm of rectum: Secondary | ICD-10-CM

## 2016-07-14 DIAGNOSIS — R6521 Severe sepsis with septic shock: Secondary | ICD-10-CM

## 2016-07-14 DIAGNOSIS — D509 Iron deficiency anemia, unspecified: Secondary | ICD-10-CM

## 2016-07-14 DIAGNOSIS — C189 Malignant neoplasm of colon, unspecified: Secondary | ICD-10-CM

## 2016-07-14 DIAGNOSIS — R109 Unspecified abdominal pain: Secondary | ICD-10-CM

## 2016-07-14 DIAGNOSIS — G893 Neoplasm related pain (acute) (chronic): Secondary | ICD-10-CM

## 2016-07-14 DIAGNOSIS — J9602 Acute respiratory failure with hypercapnia: Secondary | ICD-10-CM

## 2016-07-14 DIAGNOSIS — N289 Disorder of kidney and ureter, unspecified: Secondary | ICD-10-CM

## 2016-07-14 DIAGNOSIS — A419 Sepsis, unspecified organism: Secondary | ICD-10-CM

## 2016-07-14 LAB — BLOOD GAS, ARTERIAL
ACID-BASE DEFICIT: 6.7 mmol/L — AB (ref 0.0–2.0)
Acid-base deficit: 4.7 mmol/L — ABNORMAL HIGH (ref 0.0–2.0)
BICARBONATE: 23.1 mmol/L (ref 20.0–28.0)
BICARBONATE: 19.5 mmol/L — AB (ref 20.0–28.0)
DRAWN BY: 11249
Drawn by: 11249
FIO2: 100
MECHVT: 550 mL
O2 CONTENT: 8 L/min
O2 SAT: 83.7 %
O2 SAT: 100 %
PATIENT TEMPERATURE: 97.6
PATIENT TEMPERATURE: 97.6
PCO2 ART: 75.5 mmHg — AB (ref 32.0–48.0)
PCO2 ART: 33.3 mmHg (ref 32.0–48.0)
PEEP: 5 cmH2O
PO2 ART: 64.8 mmHg — AB (ref 83.0–108.0)
PO2 ART: 312 mmHg — AB (ref 83.0–108.0)
RATE: 26 resp/min
TCO2: 23.5 mmol/L (ref 0–100)
TCO2: 18.6 mmol/L (ref 0–100)
pH, Arterial: 7.109 — CL (ref 7.350–7.450)
pH, Arterial: 7.381 (ref 7.350–7.450)

## 2016-07-14 LAB — PROCALCITONIN: PROCALCITONIN: 6.83 ng/mL

## 2016-07-14 LAB — LACTIC ACID, PLASMA
LACTIC ACID, VENOUS: 3.5 mmol/L — AB (ref 0.5–1.9)
LACTIC ACID, VENOUS: 5.1 mmol/L — AB (ref 0.5–1.9)
LACTIC ACID, VENOUS: 5.2 mmol/L — AB (ref 0.5–1.9)

## 2016-07-14 LAB — TROPONIN I
TROPONIN I: 0.04 ng/mL — AB (ref <0.03)
TROPONIN I: 0.04 ng/mL — AB (ref <0.03)
Troponin I: 0.04 ng/mL (ref <0.03)

## 2016-07-14 LAB — CBC WITH DIFFERENTIAL/PLATELET
BASOS PCT: 0 %
Basophils Absolute: 0 10*3/uL (ref 0.0–0.1)
EOS PCT: 1 %
Eosinophils Absolute: 0.1 10*3/uL (ref 0.0–0.7)
HEMATOCRIT: 24.2 % — AB (ref 39.0–52.0)
HEMOGLOBIN: 7.9 g/dL — AB (ref 13.0–17.0)
Lymphocytes Relative: 4 %
Lymphs Abs: 0.2 10*3/uL — ABNORMAL LOW (ref 0.7–4.0)
MCH: 25.5 pg — ABNORMAL LOW (ref 26.0–34.0)
MCHC: 32.6 g/dL (ref 30.0–36.0)
MCV: 78.1 fL (ref 78.0–100.0)
MONO ABS: 0.4 10*3/uL (ref 0.1–1.0)
MONOS PCT: 6 %
NEUTROS PCT: 89 %
Neutro Abs: 5.3 10*3/uL (ref 1.7–7.7)
Platelets: 60 10*3/uL — ABNORMAL LOW (ref 150–400)
RBC: 3.1 MIL/uL — AB (ref 4.22–5.81)
RDW: 20.9 % — AB (ref 11.5–15.5)
WBC: 6 10*3/uL (ref 4.0–10.5)

## 2016-07-14 LAB — COMPREHENSIVE METABOLIC PANEL
ALT: 19 U/L (ref 17–63)
AST: 35 U/L (ref 15–41)
Albumin: 1.5 g/dL — ABNORMAL LOW (ref 3.5–5.0)
Alkaline Phosphatase: 567 U/L — ABNORMAL HIGH (ref 38–126)
Anion gap: 9 (ref 5–15)
BUN: 46 mg/dL — AB (ref 6–20)
CHLORIDE: 110 mmol/L (ref 101–111)
CO2: 21 mmol/L — AB (ref 22–32)
CREATININE: 1.88 mg/dL — AB (ref 0.61–1.24)
Calcium: 7 mg/dL — ABNORMAL LOW (ref 8.9–10.3)
GFR calc non Af Amer: 37 mL/min — ABNORMAL LOW (ref >60)
GFR, EST AFRICAN AMERICAN: 43 mL/min — AB (ref >60)
Glucose, Bld: 139 mg/dL — ABNORMAL HIGH (ref 65–99)
POTASSIUM: 3.6 mmol/L (ref 3.5–5.1)
SODIUM: 140 mmol/L (ref 135–145)
Total Bilirubin: 1.7 mg/dL — ABNORMAL HIGH (ref 0.3–1.2)
Total Protein: 4.5 g/dL — ABNORMAL LOW (ref 6.5–8.1)

## 2016-07-14 LAB — ECHOCARDIOGRAM COMPLETE
Height: 68 in
Weight: 2335.11 oz

## 2016-07-14 LAB — CULTURE, BLOOD (ROUTINE X 2)
CULTURE: NO GROWTH
Culture: NO GROWTH

## 2016-07-14 LAB — GLUCOSE, CAPILLARY: GLUCOSE-CAPILLARY: 115 mg/dL — AB (ref 65–99)

## 2016-07-14 LAB — VANCOMYCIN, RANDOM: Vancomycin Rm: 19

## 2016-07-14 LAB — MAGNESIUM: Magnesium: 2.2 mg/dL (ref 1.7–2.4)

## 2016-07-14 LAB — MRSA PCR SCREENING: MRSA by PCR: NEGATIVE

## 2016-07-14 LAB — PHOSPHORUS: PHOSPHORUS: 4 mg/dL (ref 2.5–4.6)

## 2016-07-14 MED ORDER — ROCURONIUM BROMIDE 50 MG/5ML IV SOLN
40.0000 mg | Freq: Once | INTRAVENOUS | Status: AC
  Administered 2016-07-14: 40 mg via INTRAVENOUS
  Filled 2016-07-14: qty 4

## 2016-07-14 MED ORDER — CEFEPIME HCL 2 G IJ SOLR
2.0000 g | INTRAMUSCULAR | Status: AC
  Administered 2016-07-14: 2 g via INTRAVENOUS
  Filled 2016-07-14: qty 2

## 2016-07-14 MED ORDER — PIPERACILLIN-TAZOBACTAM 3.375 G IVPB
3.3750 g | Freq: Three times a day (TID) | INTRAVENOUS | Status: DC
  Administered 2016-07-14 – 2016-07-17 (×9): 3.375 g via INTRAVENOUS
  Filled 2016-07-14 (×9): qty 50

## 2016-07-14 MED ORDER — VANCOMYCIN HCL 500 MG IV SOLR
500.0000 mg | Freq: Once | INTRAVENOUS | Status: AC
  Administered 2016-07-14: 500 mg via INTRAVENOUS
  Filled 2016-07-14: qty 500

## 2016-07-14 MED ORDER — FENTANYL CITRATE (PF) 100 MCG/2ML IJ SOLN
25.0000 ug | INTRAMUSCULAR | Status: DC | PRN
  Administered 2016-07-14 – 2016-07-16 (×11): 50 ug via INTRAVENOUS
  Administered 2016-07-16: 25 ug via INTRAVENOUS
  Administered 2016-07-17 (×2): 50 ug via INTRAVENOUS
  Filled 2016-07-14 (×14): qty 2

## 2016-07-14 MED ORDER — DEXTROSE 5 % IV SOLN: 1.0000 g | Freq: Three times a day (TID) | INTRAVENOUS | Status: DC

## 2016-07-14 MED ORDER — NOREPINEPHRINE BITARTRATE 1 MG/ML IV SOLN
0.0000 ug/min | INTRAVENOUS | Status: DC
  Administered 2016-07-14: 20 ug/min via INTRAVENOUS
  Administered 2016-07-14: 12 ug/min via INTRAVENOUS
  Administered 2016-07-14 (×2): 10 ug/min via INTRAVENOUS
  Administered 2016-07-15: 6 ug/min via INTRAVENOUS
  Administered 2016-07-15: 7 ug/min via INTRAVENOUS
  Administered 2016-07-15: 10 ug/min via INTRAVENOUS
  Administered 2016-07-16: 7 ug/min via INTRAVENOUS
  Administered 2016-07-16 (×2): 10 ug/min via INTRAVENOUS
  Administered 2016-07-17: 8 ug/min via INTRAVENOUS
  Filled 2016-07-14 (×10): qty 4

## 2016-07-14 MED ORDER — FAMOTIDINE IN NACL 20-0.9 MG/50ML-% IV SOLN
20.0000 mg | Freq: Two times a day (BID) | INTRAVENOUS | Status: DC
  Administered 2016-07-14 – 2016-07-15 (×4): 20 mg via INTRAVENOUS
  Filled 2016-07-14 (×4): qty 50

## 2016-07-14 MED ORDER — CHLORHEXIDINE GLUCONATE 0.12 % MT SOLN
15.0000 mL | Freq: Two times a day (BID) | OROMUCOSAL | Status: DC
  Administered 2016-07-14 – 2016-07-17 (×7): 15 mL via OROMUCOSAL
  Filled 2016-07-14: qty 15

## 2016-07-14 MED ORDER — SODIUM CHLORIDE 0.9 % IV SOLN
750.0000 mL | INTRAVENOUS | Status: DC | PRN
  Administered 2016-07-14 (×2): 750 mL via INTRAVENOUS

## 2016-07-14 MED ORDER — NOREPINEPHRINE BITARTRATE 1 MG/ML IV SOLN
0.0000 ug/min | Freq: Once | INTRAVENOUS | Status: DC
  Administered 2016-07-14: 10 ug/min via INTRAVENOUS
  Filled 2016-07-14: qty 4

## 2016-07-14 MED ORDER — DEXTROSE 5 % IV SOLN: 1.0000 g | Freq: Two times a day (BID) | INTRAVENOUS | Status: DC

## 2016-07-14 MED ORDER — ORAL CARE MOUTH RINSE
15.0000 mL | Freq: Four times a day (QID) | OROMUCOSAL | Status: DC
  Administered 2016-07-14 – 2016-07-17 (×12): 15 mL via OROMUCOSAL

## 2016-07-14 MED ORDER — MIDAZOLAM HCL 2 MG/2ML IJ SOLN
1.0000 mg | INTRAMUSCULAR | Status: DC | PRN
  Administered 2016-07-14 – 2016-07-16 (×6): 2 mg via INTRAVENOUS
  Administered 2016-07-16: 1 mg via INTRAVENOUS
  Filled 2016-07-14 (×7): qty 2

## 2016-07-14 MED ORDER — ETOMIDATE 2 MG/ML IV SOLN
20.0000 mg | Freq: Once | INTRAVENOUS | Status: AC
  Administered 2016-07-14: 20 mg via INTRAVENOUS

## 2016-07-14 MED ORDER — METRONIDAZOLE IN NACL 5-0.79 MG/ML-% IV SOLN
500.0000 mg | Freq: Three times a day (TID) | INTRAVENOUS | Status: DC
  Administered 2016-07-14 – 2016-07-15 (×3): 500 mg via INTRAVENOUS
  Filled 2016-07-14 (×3): qty 100

## 2016-07-14 MED ORDER — SODIUM CHLORIDE 0.9 % IV BOLUS (SEPSIS)
2000.0000 mL | INTRAVENOUS | Status: DC | PRN
  Administered 2016-07-14: 2000 mL via INTRAVENOUS
  Filled 2016-07-14: qty 2000

## 2016-07-14 MED ORDER — ASPIRIN 300 MG RE SUPP
150.0000 mg | Freq: Every day | RECTAL | Status: DC
  Administered 2016-07-14 – 2016-07-16 (×3): 150 mg via RECTAL
  Filled 2016-07-14 (×2): qty 1

## 2016-07-14 NOTE — Progress Notes (Signed)
Nutrition Follow-up  DOCUMENTATION CODES:   Severe malnutrition in context of acute illness/injury  INTERVENTION:  - Will monitor POC/GOC and follow-up 8/31. - Should TF be within POC/GOC, recommend Jevity 1.2. Initiate Jevity 1.2 @ 25 mL/hr and increase by 10 mL every 6 hours to reach goal of Jevity 1.2 @ 65 mL/hr. At goal rate, this regimen will provide 1872 kcal, 87 grams of protein, and 1259 mL free water.  - If TF to be initiated, monitor magnesium, potassium, and phosphorus daily for at least 3 days, MD to replete as needed, as pt is at risk for refeeding syndrome given poor appetite and variable intakes.  NUTRITION DIAGNOSIS:   Malnutrition related to acute illness as evidenced by severe depletion of body fat, severe depletion of muscle mass. -ongoing  GOAL:   Patient will meet greater than or equal to 90% of their needs -unmet, unable to meet.  MONITOR:   Vent status, Weight trends, Labs, Skin, I & O's  REASON FOR ASSESSMENT:   Ventilator  ASSESSMENT:   60 y.o. 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 [name] from oncology and has started chemotherapy with round 1 which was about 2 days ago. He presented to the emergency room yesterday due to intractable nausea and vomiting, and improvement in the ED and was sent home.  8/30 Rapid response called overnight after pt found to be unresponsive. Pt was transferred to the ICU and subsequently intubated. Per rounds this AM, possible terminal extubation to occur; will continue to monitor for POC/GOC; last CCM MD note was today at 0341. Estimated nutrition needs updated based on intubation and TF recommendations outlined above should they be warranted.  Per chart review, pt previously on Regular diet with documented 50% of lunch 8/27; 25%  of lunch and 100% of dinner on 8/29 and no other recent meal intakes documented. Pt likely at risk for refeeding should TF be initiated given poor appetite and variable intakes.   Patient is currently intubated on ventilator support MV: 15.1 L/min Temp (24hrs), Avg:98 F (36.7 C), Min:97.6 F (36.4 C), Max:98.8 F (37.1 C) Propofol: none  Medications reviewed; 40 mg IV Lasix x1 dose yesterday, 4 mg IV Zofran QID, PRN IV Phenergan.  Labs reviewed; BUN: 46 mg/dL, creatinine: 1.88 mg/dL, Ca: 7 mg/dL, Alk Phos elevated but trending down, GFR: 43 mL/min.   IVF: D5-1/2 NS @ 75 mL/hr (306 kcal).  Drip: Levo @ 8 mcg/min.     8/25 - Patient in room with family at bedside.  - Family reports pt had an appointment to see the [place] RD this week.  - Pt has had N/V following his last chemo treatment.  - Pt reports swallowing difficulty which has started over the last 2-3 days. -  States it is harder to drink liquids and take his medications.  - Family reports pt has been treated for thrush as well.  - Reviewed mouth rinsing practices which can help.  - Pt likes Ensure supplements and is agreeable to receiving supplements between meals.  - Pt states he is not hungry at all.  - His family encourages him and gets him to eat at least once a day, maybe twice.  - He does not want to eat today but agreed to have RD order him some soup for lunch. - Patient's weight has remained  stable ~140 lbs.  - Nutrition-Focused physical exam completed. Findings are severe fat depletion, severe muscle depletion, and no edema.    Diet Order:  Diet NPO time specified  Skin:  Wound (see comment) (Stage 2 sacral pressure injury)  Last BM:  8/25  Height:   Ht Readings from Last 1 Encounters:  07/14/16 5' 8" (1.727 m)    Weight:   Wt Readings from Last 1 Encounters:  07/14/16 145 lb 15.1 oz (66.2 kg)    Ideal Body Weight:  83.6 kg  BMI:  Body mass index is 22.19 kg/m.  Estimated Nutritional  Needs:   Kcal:  1844  Protein:  79-99 grams (1.2-1.5 grams/kg)  Fluid:  >/= 2 L/day  EDUCATION NEEDS:   Education needs addressed     , MS, RD, LDN Inpatient Clinical Dietitian Pager # [phone] After hours/weekend pager # [phone]  

## 2016-07-14 NOTE — Procedures (Signed)
Central Venous Catheter Insertion Procedure Note Andre Fernandez [id number] 26-Mar-1956  Procedure: Insertion of Central Venous Catheter Indications: Assessment of intravascular volume, Drug and/or fluid administration and Frequent blood sampling  Procedure Details Consent: Risks of procedure as well as the alternatives and risks of each were explained to the (patient/caregiver).  Consent for procedure obtained. Time Out: Verified patient identification, verified procedure, site/side was marked, verified correct patient position, special equipment/implants available, medications/allergies/relevent history reviewed, required imaging and test results available.  Performed  Maximum sterile technique was used including antiseptics, cap, gloves, gown, hand hygiene, mask and sheet. Skin prep: Chlorhexidine; local anesthetic administered A antimicrobial bonded/coated triple lumen catheter was placed in the right subclavian vein using the Seldinger technique.  Evaluation Blood flow good Complications: No apparent complications Patient did tolerate procedure well. Chest X-ray ordered to verify placement.  CXR: normal.  Clementeen Graham 07/14/2016, 2:54 PM

## 2016-07-14 NOTE — Progress Notes (Signed)
Shift event note:  Notified by RN just after shift change regarding pt c/o feeling SOB and feeling like he was drowning. Minimal relief w/ albutero; neb. RN reports BBS w/ crackles. Slight improvement in sx's w/ application of 02  at 4L. 02 sats in low 90's on 4L  from 97% on R/A. Stat portable CXR obtained and revealed enlarging pleural effusions and new central basilar airspace opacities c/w alveolar edema (but infectious infiltrate not excluded). Pt initially on Vanc and Cefepime. Cefepime d/c'd on 8/27 and plan was to d/c home on Vanc via port for 14 days. Lasix 40 mg was ordered and was given IV at approx 2130. Notified again at approx 1215 that pt had not voided but was feeling some better and appeared to be resting comfortably at this time. RN ask to get bladder scan and call results. RN reports she went in at approx 0200 to bladder scan pt and found him barely responsive w/ SBP in the 80's. This NP was paged but unable to respond to bedside immediately so RR RN was paged and responded to bedside. Per RR RN assessment pt unresponsive, ABG obtained > 7.1/75.5/64.8/23.1. Hypotensive w/ SBP now in the 70's. Pt transferred immediately to ICU where I saw pt at bedside. On assessment pt will open eyes, raise eyebrows and grimace to sternal rub but there is no verbal response or movement of extremities. RRT at bedside and attempted BiPAP but unable to get adequate seal given thick beard and LOC.  Assessment/Plan: 1. Acute hypercapnic/hypoxic respiratory failure: Changes on CXR > ? Fluid overload vs aspiration. Dr Ashok Cordia w/ PCCM on unit. Discussed pt w/ Dr Ashok Cordia who agreed to evaluate/intubate pt. Remained at bedside until successful intubation.  2. Hypotension: Septic shock in setting of  (Bacillus) bacteremia and stage IV colon cancer s/p first round of chemotherapy. Levophed per PCCM. Pt on Vanc. Will defer antibiotic management to PCCM. Accompanied Dr Ashok Cordia as he updated family (wife, daughter, granddaugther  and son about pt's status. Pt remains full code. Appreciate PCCM input and management.   Jeryl Columbia, NP-C [facility] Pager [phone]

## 2016-07-14 NOTE — Consult Note (Addendum)
PULMONARY / CRITICAL CARE MEDICINE   Name: Andre Fernandez MRN: [id number] DOB: [birth year]    ADMISSION DATE:  07/06/2016 CONSULTATION DATE:  07/14/2016  REFERRING MD:  Chaney Malling, N.P. / [facility]  CHIEF COMPLAINT:  Septic Shock & Hypercarbic Respiratory Failure  HISTORY OF PRESENT ILLNESS:  History obtained from hospitalist nurse practitioner bedside as well as the patient's electronic medical record given patient's altered mental status. 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 Patient was discharged approximately 2 weeks ago after having diverging left colostomy and completing one round of chemotherapy approximately 2 days prior to admission. Patient was subsequently admitted to the hospital after having blood cultures done in the emergency department and started on empiric vancomycin and cefepime for bacteremia. Case was briefly discussed with infectious diseases and vancomycin was to be continued.  PAST MEDICAL HISTORY :  Past Medical History:  Diagnosis Date  . Cancer ([place])    colon, liver mets per record from [facility]  . GERD (gastroesophageal reflux disease)    use to have reflux, reports that lately he hhas been regurgitating "foamy" substance   . Heart murmur    told that he has a "light murmur"   . Hypertension   . Kidney stones 1980's   both times treated with cystoscopy    PAST SURGICAL HISTORY: Past Surgical History:  Procedure Laterality Date  . CYSTOSCOPY    . insertion of a  port a cath  06/18/2016  . LAPAROSCOPIC DIVERTED COLOSTOMY  06/18/2016  . LAPAROSCOPIC DIVERTED COLOSTOMY N/A 06/18/2016   Procedure: LAPAROSCOPIC DIVERTED COLOSTOMY;  Surgeon: Stark Klein, MD;  Location: [facility];  Service: General;  Laterality: N/A;  . PORTACATH PLACEMENT N/A 06/18/2016   Procedure: INSERTION PORT-A-CATH;  Surgeon: Stark Klein, MD;  Location: [facility];  Service: General;  Laterality: N/A;    No Known Allergies  No current facility-administered medications on  file prior to encounter.    Current Outpatient Prescriptions on File Prior to Encounter  Medication Sig  . bimatoprost (LUMIGAN) 0.03 % ophthalmic solution Place 1 drop into both eyes at bedtime.  . bisoprolol-hydrochlorothiazide (ZIAC) 10-6.25 MG tablet Take 1 tablet by mouth every evening.   . lidocaine-prilocaine (EMLA) cream Apply to port site one hour prior to use. Do not rub in, cover with plastic. (Patient taking differently: Apply 1 application topically daily as needed (port access). Apply to port site one hour prior to use. Do not rub in, cover with plastic.)  . methocarbamol (ROBAXIN) 500 MG tablet Take 1 tablet (500 mg total) by mouth every 8 (eight) hours as needed for muscle spasms.  Marland Kitchen oxyCODONE-acetaminophen (PERCOCET/ROXICET) 5-325 MG tablet Take 1-2 tablets by mouth every 4 (four) hours as needed for severe pain.  . Polyethylene Glycol 3350 (MIRALAX PO) Take 17 g by mouth daily. Mix in 8 ounces of liquid  . prochlorperazine (COMPAZINE) 10 MG tablet Take 1 tablet (10 mg total) by mouth every 6 (six) hours as needed for nausea or vomiting.  . Rivaroxaban 15 & 20 MG TBPK Take as directed on package: Start with one 15mg  tablet by mouth twice a day with food. On Day 22, switch to one 20mg  tablet once a day with food.  . senna-docusate (SENNA S) 8.6-50 MG tablet Take 1-2 tablets by mouth 2 (two) times daily as needed for moderate constipation (take 1 tablet twice daily every day and if needed). take 1 tablet twice daily every day and if needed Can increase to #2 twice daily  Or  can decrease to daily if needed  . fluconazole (DIFLUCAN) 100 MG tablet Take 1 tablet (100 mg total) by mouth daily. (Patient not taking: Reported on 07/07/2016)    FAMILY HISTORY:  History reviewed. No pertinent family history.   SOCIAL HISTORY: Social History  Substance Use Topics  . Smoking status: Former Smoker    Types: Cigarettes    Quit date: 11/16/2011  . Smokeless tobacco: Never Used  . Alcohol use  No     Comment: Quit 3 years ago    REVIEW OF SYSTEMS:  Unable to obtain given altered mental status.  SUBJECTIVE: As above.  VITAL SIGNS: BP (!) 75/63   Pulse (!) 111   Temp 97.6 F (36.4 C) (Axillary)   Resp (!) 30   Ht 6\' 1"  (1.854 m)   Wt 145 lb (65.8 kg)   SpO2 (!) 89%   BMI 19.13 kg/m   HEMODYNAMICS:    VENTILATOR SETTINGS:    INTAKE / OUTPUT: I/O last 3 completed shifts: In: [id number] [P.O.:420; I.V.:2710; IV Piggyback:200] Out: 1000 [Urine:950; Stool:50]  PHYSICAL EXAMINATION: General:  Eyes open. Ex-wife at bedside. Staff at bedside. Thin, cachectic appearing male. Integument:  Warm & dry. No rash on exposed skin.  Lymphatics:  No appreciated cervical or supraclavicular lymphadenoapthy. HEENT:  Moist mucus membranes.No scleral injection or icterus.  Cardiovascular:  Tachycardic with regular rhythm. No appreciable JVD.  Pulmonary:  Coarse breath sounds bilaterally with shallow breathing. Accessory muscle use. Abdomen: Soft. Normal bowel sounds. Nondistended.  Musculoskeletal:  Symmetrically decreased bulk and tone. No joint deformity or effusion appreciated. Neurological: Doesn't follow commands. Pupils symmetric. No meningismus. Psychiatric:  Unable to assess given altered mental status.  LABS:  BMET  Recent Labs Lab 07/11/16 0500 07/12/16 1030 07/13/16 0827  NA 139 137 140  K 3.2* 3.2* 3.1*  CL 108 107 108  CO2 22 21* 24  BUN 29* 34* 41*  CREATININE 0.85 1.30* 1.51*  GLUCOSE 135* 126* 122*    Electrolytes  Recent Labs Lab 06/21/2016 0830  07/11/16 0500 07/12/16 1030 07/13/16 0827  CALCIUM  --   < > 7.6* 7.1* 7.3*  MG 2.3  --   --   --   --   < > = values in this interval not displayed.  CBC  Recent Labs Lab 07/10/16 0555 07/11/16 0500 07/12/16 1030  WBC 13.9* 26.1* 23.1*  HGB 8.0* 8.0* 8.3*  HCT 23.5* 23.7* 24.3*  PLT 208 138* 78*    Coag's  Recent Labs Lab 07/12/2016 0831  INR 4.53*    Sepsis Markers  Recent Labs Lab  07/02/2016 0837  LATICACIDVEN 2.74*    ABG  Recent Labs Lab 07/14/16 0217  PHART 7.109*  PCO2ART 75.5*  PO2ART 64.8*    Liver Enzymes  Recent Labs Lab 06/16/2016 0834 07/10/16 0555  AST 53* 45*  ALT 35 33  ALKPHOS 1,243* 1,151*  BILITOT 1.0 0.9  ALBUMIN 1.7* 1.8*    Cardiac Enzymes No results for input(s): TROPONINI, PROBNP in the last 168 hours.  Glucose  Recent Labs Lab 07/13/16 0740  GLUCAP 122*    Imaging US Renal  Result Date: 07/13/2016 CLINICAL DATA:  Renal failure. EXAM: RENAL / URINARY TRACT ULTRASOUND COMPLETE COMPARISON:  None. FINDINGS: Right Kidney: Length: 9.8 cm. Echogenicity within normal limits. No mass or hydronephrosis visualized. Left Kidney: Length: 10.4 cm. Echogenicity within normal limits. No mass or hydronephrosis visualized. Bladder: Appears normal for degree of bladder distention. IMPRESSION: Normal renal ultrasound. Electronically Signed   By:  Kathreen Devoid   On: 07/13/2016 16:27   Dg Chest Port 1 View  Result Date: 07/13/2016 CLINICAL DATA:  Hypoxia tonight.  Bilateral crackles. EXAM: PORTABLE CHEST 1 VIEW COMPARISON:  07/04/2016 FINDINGS: There is a left subclavian Port-A-Cath with tip at the cavoatrial junction. There are moderately large pleural effusions bilaterally, increased from 07/04/2016. There is central and basilar airspace opacity bilaterally which may represent alveolar edema. The findings may represent fluid overload/CHF but a component of infectious infiltrate cannot be excluded. IMPRESSION: Enlarging pleural effusions compared 07/04/2016. New central and basilar airspace opacities may represent alveolar edema. Electronically Signed   By: Andreas Newport M.D.   On: 07/13/2016 21:56     STUDIES:  Venous Duplex 06/30/16: Acute DVT in left posterior tibial vein mid-calf to ankle w/ "sluggish" flow throughout the entire lower leg. Also new forming thrombus behind a valve in the distal popliteal vein.   MICROBIOLOGY: Blood Ctx  x2 8/23:  Bacillus species 1/2 bottles  Urine Ctx 8/25:  Negative Blood Ctx x2 8/25 >> Blood Ctx L CVL 8/28 >>  ANTIBIOTICS: Vancomycin 8/25 >> Cefepime 8/25 - 8/27; 8/30 >>  SIGNIFICANT EVENTS: 8/25 - Admit 8/30 - Transfer to ICU w/ worsening shock & hypercarbic respiratory failure  LINES/TUBES: OETT 7.5 8/30 >> L Chest [facility] 8/25 >> PIV  DISCUSSION:  61 y.o. 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 Patient status post 1 round of chemotherapy 2 days prior to admission with decreasing counts. Admitted with bacteremia and treated with broad-spectrum antibiotic therapy. Cefepime discontinued on 8/27. Patient developed worsening hypercarbic respiratory failure and shock likely secondary to underlying sepsis with altered mental status on 8/30. Patient was transferred to the intensive care unit and emergent endotracheal airway was inserted. Started on vasopressor support. Family updated regarding critical status and high probability of further decline and demise.  ASSESSMENT / PLAN:  PULMONARY A: Acute Hypercarbic Respiratory Failure Acute Hypoxic Respiratory Failure Probable Aspiration  P:   Full Vent Support Post intubation ABG & Portable CXR pending  CARDIOVASCULAR A:  Shock - Likely secondary to sepsis. H/O HTN H/O Heart Murmur  P:  Vitals per unit protocol Continuous telemetry monitoring Levophed to maintain MAP >65 & SBP >90 Trending Troponin I q6hr Checking TTE Holding home anti-hypertensive medications  INFECTIOUS A:   Sepsis Bacillus Species Bacteremia  P:   Empiric Vancomycin continuing Restarting Cefepime with pharmacy dosing  Checking Tracheal Aspirate Culture Awaiting finalization of blood cultures Trending Procalcitonin per algorithm   HEMATOLOGIC/ONCOLOGIC A:   Stage IV Colon Cancer - S/P 1 round of chemo 2 days prior to admit 8/25. Anemia - No signs of active bleeding. Thrombocytopenia LLE DVT - Noted on 8/16. On tx w/  Xarleto.  P:  Repeat CBC now Stat Type & Screen Holding Xarelto Placing SCDs on patient  RENAL A:   Acute Renal Failure - Worsening. Oliguria Hypokalemia  P:   Trending UOP Checking electrolytes & renal function now Holding off on Foley Catheter Replacing electrolytes as indicated Trending LA q6hr  GASTROINTESTINAL A:   Colon Cancer Stage IV - S/P diverting ostomy. H/O GERD  P:   NPO Pepcid IV q12hr Unable to pass OGT - holding off on further attempts for now  ENDOCRINE A:   Hyperglycemia - No h/o DM. Mild.  P:   Monitoring serum glucose on daily labs.  NEUROLOGIC A:   Sedation on Ventilator Acute Encephalopathy - Multifactorial from respiratory failure & likely toxic metabolic from sepsis.  P:   RASS goal: 0 to -1 Versed IV prn Sedation Fentanyl IV prn Pain  FAMILY  - Updates: Ex-wife, Son, & Daughter updated by Dr. Ashok Cordia 8/30.  - Inter-disciplinary family meet or Palliative Care meeting due by:  8/6   I spent a total of 41 minutes of critical care time this morning independent of time spent during procedures caring for the patient, reviewing the patient's electronic medical record, discussing plan of care with hospitalist at bedside, and updated patient's family.  Sonia Baller Ashok Cordia, M.D. [facility] Pager:  [phone] After 3pm or if no response, call [phone] 07/14/2016, 3:42 AM

## 2016-07-14 NOTE — Progress Notes (Signed)
IP PROGRESS NOTE  Subjective:   He was admitted 8/25 after a blood culture from 8/23 returned positive for a bacillus species.  He has been maintained on  Vancomycin since admission, cefepime was discontinued 8/28.  He had nausea and vomiting yesterday.  He developed respiratory failure this AM requiring intubation. He has evidence of sepsis.    Objective: Vital signs in last 24 hours: Blood pressure 94/68, pulse (!) 127, temperature 98.2 F (36.8 C), temperature source Oral, resp. rate (!) 36, height 5\' 8"  (1.727 m), weight 145 lb 15.1 oz (66.2 kg), SpO2 98 %.  Intake/Output from previous day: 08/29 0701 - 08/30 0700 In: 2402.5 [P.O.:440; I.V.:1872.5; IV Piggyback:50] Out: 325 [Urine:325]  Physical Exam: Intubated on the ventilator.Alert and follows simple commands.  Lungs: moves air bilaterrally Cardiac: rrr, tachycardia Abdomen: distended, firm, small amount of liquid stool in ostomy bag    Portacath/PICC-without erythema  Lab Results:  Recent Labs  07/12/16 1030 07/14/16 0340  WBC 23.1* 6.0  HGB 8.3* 7.9*  HCT 24.3* 24.2*  PLT 78* 60*    BMET  Recent Labs  07/13/16 0827 07/14/16 0340  NA 140 140  K 3.1* 3.6  CL 108 110  CO2 24 21*  GLUCOSE 122* 139*  BUN 41* 46*  CREATININE 1.51* 1.88*  CALCIUM 7.3* 7.0*    Studies/Results: US Renal  Result Date: 07/13/2016 CLINICAL DATA:  Renal failure. EXAM: RENAL / URINARY TRACT ULTRASOUND COMPLETE COMPARISON:  None. FINDINGS: Right Kidney: Length: 9.8 cm. Echogenicity within normal limits. No mass or hydronephrosis visualized. Left Kidney: Length: 10.4 cm. Echogenicity within normal limits. No mass or hydronephrosis visualized. Bladder: Appears normal for degree of bladder distention. IMPRESSION: Normal renal ultrasound. Electronically Signed   By: Kathreen Devoid   On: 07/13/2016 16:27   Dg Chest Port 1 View  Result Date: 07/14/2016 CLINICAL DATA:  Central line placement EXAM: PORTABLE CHEST 1 VIEW COMPARISON:   Chest radiograph from earlier today. FINDINGS: Endotracheal tube tip is 1.5 cm above the carina. Right subclavian central venous catheter terminates in the lower third of the superior vena cava. Left subclavian MediPort terminates at the cavoatrial junction. Stable cardiomediastinal silhouette with normal heart size. No pneumothorax. Small to moderate bilateral pleural effusions are stable. Fluffy parahilar opacities throughout both lungs appear stable. IMPRESSION: 1. No pneumothorax. Right subclavian central venous catheter appears well positioned with the tip in the lower third of the superior vena cava. 2. Endotracheal tube tip is 1.5 cm above the carina, consider retracting 0.5 - 1 cm. 3. Stable small to moderate bilateral pleural effusions. 4. Stable fluffy parahilar opacities throughout both lungs, favor noncardiogenic pulmonary edema/ARDS, cannot exclude multifocal pneumonia or diffuse alveolar hemorrhage. Electronically Signed   By: Ilona Sorrel M.D.   On: 07/14/2016 14:54   Dg Chest Port 1 View  Result Date: 07/14/2016 CLINICAL DATA:  Intubation EXAM: PORTABLE CHEST 1 VIEW COMPARISON:  07/13/2016 FINDINGS: The endotracheal tube is 2 cm above the carina. There is a left subclavian Port-A-Cath extending into the cavoatrial junction. Effusions persist bilaterally without significant interval change. No pneumothorax is evident. No interval change in the bilateral central and basilar airspace opacities. IMPRESSION: 1.  Support equipment appears satisfactorily positioned. 2. No significant interval change in the bilateral airspace opacities. Electronically Signed   By: Andreas Newport M.D.   On: 07/14/2016 03:58   Dg Chest Port 1 View  Result Date: 07/13/2016 CLINICAL DATA:  Hypoxia tonight.  Bilateral crackles. EXAM: PORTABLE CHEST 1 VIEW COMPARISON:  07/10/2016  FINDINGS: There is a left subclavian Port-A-Cath with tip at the cavoatrial junction. There are moderately large pleural effusions  bilaterally, increased from 06/22/2016. There is central and basilar airspace opacity bilaterally which may represent alveolar edema. The findings may represent fluid overload/CHF but a component of infectious infiltrate cannot be excluded. IMPRESSION: Enlarging pleural effusions compared 06/17/2016. New central and basilar airspace opacities may represent alveolar edema. Electronically Signed   By: Andreas Newport M.D.   On: 07/13/2016 21:56    Medications: I have reviewed the patient's current medications.  Assessment/Plan: 1. 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 CTs of the chest, abdomen, and pelvis on 06/07/2016 revealed mediastinal lymphadenopathy, lung nodules, liver metastases, a mass that appeared to be centered at the sigmoid colon with surrounding lymphadenopathy and peritoneal implants ? 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 CT chest 06/30/2016-moderate bilateral pleural effusions, ascites, progressive lung and liver metastases ? Cycle 1 Folfox  07/06/16  2. Microcytic anemia  3. Pain secondary to #1  4. Fever  5. Tachycardia, no apparent explanation, referred for a stat chest CT 06/30/2016-negative for pulmonary embolism  6.  left lower extremity DVT confirmed on Doppler 06/30/2016-Xarelto started 06/30/2016  7.   Oral candidiasis-treated with Diflucan 07/02/2016  8.   Hypercalcemia- s/p zometa 8/22  9.   Respiratory Failure 8/30- Aspiration?  10.  Sepsis Syndrome- Bacillus bacteremia  11.  Acute renal failure  12. Thrombocytopenia secondary to chemotherapy and sepsis  Andre Fernandez has metastatic colon cancer with abdominal carcinomatosis.  He is now at Day 9 following cycle 1 Folfox. He has multiorgan failure.  He has a poor prognosis for a prolonged survival. I will recommend hospice care depending on his recovery  from the acute sepsis. I will be available to discuss the situation with his family.  Recommendation: 1. Management of resp. Failure and sepsis per CCM 2. Monitor platletes and transfuse for less than 10,000 3. Hold xarelto for now, switch to lovenox if platelets recover 4. Consider switch to comfort care/hospice pending his recovery over the next few days              LOS: 5 days   Betsy Coder, MD   07/14/2016, 4:45 PM

## 2016-07-14 NOTE — Progress Notes (Signed)
  Echocardiogram 2D Echocardiogram has been performed.  Donata Clay 07/14/2016, 1:20 PM

## 2016-07-14 NOTE — Progress Notes (Addendum)
Pharmacy Antibiotic Note  Andre Fernandez is a 60 y.o. male admitted on 06/22/2016.  He was called to return to ED for positive blood cultures obtained at ED on 8/23.  PMH includes metastatic colon cancer (cycle 1 FOLFOX chemo on 07/06/16), DVT on Xarelto, sacral ulcer, and US paracentesis on 07/06/16. Patient's currently on vancomycin for bacteremia (plan to treat for 2 weeks) and augmentin for suspected aspiration PNA.  07/14/16 - day #6/14 for vancomycin - afebrile;  WBC elevated but down - scr up 1.88 (crcl~39) -Pt now intubated and on levophed - Restarted Cefepime -0341 RandomVanc=19 mg/L (last dose 8/29 @ 0400)   Plan: -Cefepime 2gm x1 then 1gm IV q12h -Give Vancomycin 500mg  x1 now ________________________________  Height: 5\' 8"  (172.7 cm) Weight: 145 lb (65.8 kg) IBW/kg (Calculated) : 68.4  Temp (24hrs), Avg:98 F (36.7 C), Min:97.6 F (36.4 C), Max:98.8 F (37.1 C)   Recent Labs Lab 06/24/2016 0834 06/30/2016 0837 07/10/16 0555 07/11/16 0500 07/11/16 2100 07/12/16 1030 07/13/16 0827 07/13/16 1457 07/14/16 0341  WBC 20.4*  --  13.9* 26.1*  --  23.1*  --   --   --   CREATININE 0.92  --  0.83 0.85  --  1.30* 1.51*  --   --   LATICACIDVEN  --  2.74*  --   --   --   --   --   --   --   VANCOTROUGH  --   --   --   --  22*  --   --  28*  --   VANCORANDOM  --   --   --   --   --   --   --   --  19    Estimated Creatinine Clearance: 48.4 mL/min (by C-G formula based on SCr of 1.51 mg/dL).    No Known Allergies  Antimicrobials this admission: 8/25 Cefepime >> 8/27 8/30 Cefepime >> 8/25 Vancomycin >>  8/27 Augmentin >>8/29  Dose adjustments this admission: 8/27: Vanc Trough at 2100 = 22 on 750mg  q12 : change to 500mg  IV q12h 8/30: Vanc Random = 19 mg/L (LD 500mg  8/29 @ 0400)  Microbiology results: 8/23 (From previous ED visit) BCx: gram variable rod, bacillus species (aerobic bottle only) 8/23 BCID: NONE DETECTED 8/25 BCx x2: ngtd 8/25 urine: neg FINAL   Thank you  for allowing pharmacy to be a part of this patient's care.   Dorrene German 07/14/2016 4:19 AM

## 2016-07-14 NOTE — Progress Notes (Signed)
Pharmacy Antibiotic Note  Andre Fernandez is a 60 y.o. male admitted on 07/04/2016.  He was called to return to ED for positive blood cultures obtained at ED on 8/23.  PMH includes metastatic colon cancer (cycle 1 FOLFOX chemo on 07/06/16), DVT on Xarelto, sacral ulcer, and US paracentesis on 07/06/16. Patient's currently on vancomycin for bacteremia (plan to treat for 2 weeks).  Cefepime restarted, but now changed to Zosyn for suspected aspiration.  Today, 07/14/2016: - Day # 6/14 vancomycin - S/p RR, intubation, and transfer to ICU 8/30 AM - Afebrile, WBC improved to WNL, Scr inc to 1.88 (CrCl~39 ml/min) - Lactic acid increased to 3.5  Plan:  Zosyn 3.375g IV Q8H infused over 4hrs.  Vancomycin 500mg  IV given already today.  Further orders will be entered pending renal function and after next vanc level on 9/1.  Measure Vanc trough at steady state.  Goal VT 15-20 mcg/mL.  Follow up renal fxn, culture results, and clinical course.   Height: 5\' 8"  (172.7 cm) Weight: 145 lb (65.8 kg) IBW/kg (Calculated) : 68.4  Temp (24hrs), Avg:98 F (36.7 C), Min:97.6 F (36.4 C), Max:98.8 F (37.1 C)   Recent Labs Lab 06/30/2016 0834 07/08/2016 0837 07/10/16 0555 07/11/16 0500 07/11/16 2100 07/12/16 1030 07/13/16 0827 07/13/16 1457 07/14/16 0340 07/14/16 0341 07/14/16 0356  WBC 20.4*  --  13.9* 26.1*  --  23.1*  --   --  6.0  --   --   CREATININE 0.92  --  0.83 0.85  --  1.30* 1.51*  --  1.88*  --   --   LATICACIDVEN  --  2.74*  --   --   --   --   --   --   --   --  3.5*  VANCOTROUGH  --   --   --   --  22*  --   --  28*  --   --   --   VANCORANDOM  --   --   --   --   --   --   --   --   --  19  --     Estimated Creatinine Clearance: 38.9 mL/min (by C-G formula based on SCr of 1.88 mg/dL).    No Known Allergies  Antimicrobials this admission: 8/25 Cefepime >> 8/27, resumed 8/30 Cefepime >> 8/30 8/25 Vancomycin >>  8/27 Augmentin >>8/29 8/30 Zosyn >>  Dose adjustments this  admission: 8/27: Vanc Trough at 2100 = 22 on 750mg  q12 : change to 500mg  IV q12h 8/29: VT @ 15:00 = 28 mcg/ml = Vanc on hold 8/30 Vrandom @ 0500: 19 mg/L, Give Vanc 500mg  x1  9/1 Vrandom @ 0600 (24 hr level) =   Microbiology results: 8/23 (From previous ED visit) BCx: 1/2 gram variable rod, bacillus species (aerobic bottle only), 1/2 NGF 8/23 BCID: NONE DETECTED 8/25 BCx x2: ngtd 8/25 urine: neg FINAL 8/28 BCx: ngtd 8/30 respiratory cxt: sent 8/30 MRSA PCR:   Thank you for allowing pharmacy to be a part of this patient's care.  Gretta Arab PharmD, BCPS Pager [phone] 07/14/2016 8:44 AM

## 2016-07-14 NOTE — Progress Notes (Signed)
Results following the Lasix administration are that the patient has not voided. PCP on call was notified.

## 2016-07-14 NOTE — Progress Notes (Signed)
Patient was bladder scanned. 43 ml. Patient did not void. Vitals: HR 110;RR28;85/63;93 % 4  L [place]. Patient audible fluid. Lungs more crackles this time.Patient is barely responsive. PCP was notified.

## 2016-07-14 NOTE — Procedures (Signed)
Consent: Procedure was performed emergently with impending respiratory arrest. [name] at bedside and updated by Dr. Ashok Cordia prior to procedure.  Medications: 1. Etomidate 20 mg IV push 2. Rocuronium 40 mg IV push   Description of Procedure: Procedure was performed emergently after updating the patient's ex-wife at bedside regarding his rapid clinical deterioration with worsening septic shock and worsening hypercarbic respiratory failure affecting his mentation. Rapid sequence intubation was performed with etomidate for sedation followed by rocuronium for paralysis. Video laryngoscope was used to have excellent visualization of the vocal cords. Patient did have dark green gastric secretions in his retropharynx indicating probable aspiration. These were suctioned quickly. A 7.5 endotracheal tube was inserted between the vocal cords with ease and secured at 26 cm at the lip. Bilateral breath sounds were auscultated. Patient had symmetric chest rise. Repeat capnographic color change was appreciated. Stat portable chest x-ray and postintubation ABG ordered. Family updated regarding critical status post intubation.

## 2016-07-14 NOTE — Progress Notes (Signed)
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 [name] now held in event patient needs CVL or other procedure.   eICU Interventions  Will put SCD on RLE only.     Intervention Category Intermediate Interventions: Best-practice therapies (e.g. DVT, beta blocker, etc.)  Sommer,Steven Eugene 07/14/2016, 4:50 AM

## 2016-07-14 NOTE — Progress Notes (Signed)
[facility] Progress Note Patient Name: Andre Fernandez DOB: 08-19-1956 MRN: [id number]   Date of Service  07/14/2016  HPI/Events of Note  Multiple issues: 1. Troponin #1 = 0.04, 2. Lactic Acid = 3.5 and ABG = 7.38/33.3/312.  eICU Interventions  Will order: 1. ASA Suppository 150 mg PR now and Q day. 2. Continue to trend Troponin. 3. Continue to trend Lactic Acid with improved BP. 4. Continue present ventilator management.      Intervention Category Major Interventions: Respiratory failure - evaluation and management;Acid-Base disturbance - evaluation and management  Andre Fernandez 07/14/2016, 5:35 AM

## 2016-07-14 NOTE — Progress Notes (Signed)
Patient getting transferred to ICU to be placed on BiPAP, as a result of ABG results. PCP coming to see patient. Report given to Gilmer Mor Rapid Response RN I anticipation of patient going to ICU.

## 2016-07-14 NOTE — Progress Notes (Signed)
Patient's vitals were : 97.72F;HR 111;RR30;75/63; SATs 89 [place] 4 Liters. Rapid Response in room.Respiratory present. Patient not responsive to voice or touch.Eyes are open.PCP notified.

## 2016-07-15 ENCOUNTER — Telehealth: Payer: Self-pay | Admitting: Nurse Practitioner

## 2016-07-15 LAB — GLUCOSE, CAPILLARY: GLUCOSE-CAPILLARY: 72 mg/dL (ref 65–99)

## 2016-07-15 LAB — BASIC METABOLIC PANEL
ANION GAP: 11 (ref 5–15)
BUN: 51 mg/dL — ABNORMAL HIGH (ref 6–20)
CO2: 16 mmol/L — ABNORMAL LOW (ref 22–32)
Calcium: 6.1 mg/dL — CL (ref 8.9–10.3)
Chloride: 111 mmol/L (ref 101–111)
Creatinine, Ser: 2.73 mg/dL — ABNORMAL HIGH (ref 0.61–1.24)
GFR, EST AFRICAN AMERICAN: 27 mL/min — AB (ref >60)
GFR, EST NON AFRICAN AMERICAN: 24 mL/min — AB (ref >60)
Glucose, Bld: 84 mg/dL (ref 65–99)
POTASSIUM: 3.5 mmol/L (ref 3.5–5.1)
SODIUM: 138 mmol/L (ref 135–145)

## 2016-07-15 LAB — VANCOMYCIN, TROUGH: Vancomycin Tr: 24 ug/mL (ref 15–20)

## 2016-07-15 LAB — PROCALCITONIN: Procalcitonin: 17.98 ng/mL

## 2016-07-15 MED ORDER — FAMOTIDINE IN NACL 20-0.9 MG/50ML-% IV SOLN
20.0000 mg | INTRAVENOUS | Status: DC
  Administered 2016-07-16 – 2016-07-17 (×2): 20 mg via INTRAVENOUS
  Filled 2016-07-15 (×2): qty 50

## 2016-07-15 NOTE — Progress Notes (Signed)
   07/15/16 1400  Clinical Encounter Type  Visited With Family  Visit Type Initial;Psychological support;Spiritual support;Critical Care  Referral From Nurse  Consult/Referral To Chaplain  Spiritual Encounters  Spiritual Needs Emotional;Other (Comment);Grief support;Prayer Special educational needs teacher)  Stress Factors  Patient Stress Factors Not reviewed  Family Stress Factors Loss;Lack of knowledge   I visited with the patient's twin brother and ex-wife. The family is going through a difficult time with the patient's condition. They are extremely spiritual people and prayer brings them comfort. The ex-wife requested that I come back and pray with the entire family at the bedside once her daughter arrives.    Please, contact Spiritual Care for further assistance.   [place] M.Div.

## 2016-07-15 NOTE — Progress Notes (Signed)
CRITICAL VALUE ALERT  Critical value received:  Vanc:24  Date of notification:  07/15/16  Time of notification:  0643  Critical value read back:Yes.    Nurse who received alert:  Jerl Santos RN  MD notified (1st page):  Warren Lacy MD- Ashby Dawes  Time of first page:  [phone]  Responding MD:  E link MD- Ashby Dawes  Time MD responded:  [phone]

## 2016-07-15 NOTE — Progress Notes (Signed)
Nutrition Follow-up  DOCUMENTATION CODES:   Severe malnutrition in context of acute illness/injury  INTERVENTION:  - RD will follow-up 9/1. - When/if nutrition support initiated, pt is at risk for refeeding.    NUTRITION DIAGNOSIS:   Malnutrition related to acute illness as evidenced by severe depletion of body fat, severe depletion of muscle mass. -ongoing  GOAL:   Patient will meet greater than or equal to 90% of their needs -unmet, unable to meet.  MONITOR:   Vent status, Weight trends, Labs, Skin, I & O's  ASSESSMENT:   60 y.o. 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 [name] from oncology and has started chemotherapy with round 1 which was about 2 days ago. He presented to the emergency room yesterday due to intractable nausea and vomiting, and improvement in the ED and was sent home.  8/31 Pt remains intubated and OGT placed shortly before RD visit. No drainage present in canister at that time but dark green drainage present in tubing. Spoke with CCM MD who reports pt with SBO and plan at this time is for decompression x1-2 days. Estimated kcal need updated this AM and weight from yesterday (66.2 kg) as weight is +5 kg since yesterday.   Per CCM MD note this AM, Levo with goal of MAP >65, patient is not a candidate for further chemo, and is not a candidate for HD. RD will follow-up tomorrow for POC/GOC.  Patient is currently intubated on ventilator support MV: 16.6 L/min Temp (24hrs), Avg:97.8 F (36.6 C), Min:96.8 F (36 C), Max:98.4 F (36.9 C) Propofol: none  Medications reviewed; 4 mg IV Zofran QID. Labs reviewed; BUN: 51 mg/dL, creatinine: 2.73 mg/dL, Ca: 6.1 mg/dL, GFR: 27 mL/min.   IVF: D5-1/2 NS @ 75 mL/hr (306 kcal).  Drips: Levo @ 6 mcg/min.   8/30 - Rapid  response called overnight after pt found to be unresponsive.  - Pt was transferred to the ICU and subsequently intubated.  - Estimated nutrition needs updated based on intubation. - Per chart review, pt previously on Regular diet with documented 50% of lunch 8/27; 25% of lunch and 100% of dinner on 8/29 and no other recent meal intakes documented.  - Pt likely at risk for refeeding should TF be initiated given poor appetite and variable intakes.   Patient is currently intubated on ventilator support MV: 15.1 L/min Temp (24hrs), Avg:98 F (36.7 C), Min:97.6 F (36.4 C), Max:98.8 F (37.1 C) Propofol: none IVF: D5-1/2 NS @ 75 mL/hr (306 kcal).  Drip: Levo @ 8 mcg/min.     8/25 - Patient in room with family at bedside.  - Family reports pt had an appointment to see the [facility] RD this week.  - Pt has had N/V following his last chemo treatment.  - Pt reports swallowing difficulty which has started over the last 2-3 days. -  States it is harder to drink liquids and take his medications.  - Family reports pt has been treated for thrush as well.  - Reviewed mouth rinsing practices which can help.  - Pt likes Ensure supplements and is agreeable to receiving supplements between meals.  - Pt states he is not hungry at all.  - His family encourages him and gets him to eat at least once a day, maybe twice.  - He does not want to eat  today but agreed to have RD order him some soup for lunch. - Patient's weight has remained stable ~140 lbs.  - Nutrition-Focused physical exam completed. Findings are severefat depletion, severemuscle depletion, and noedema.     Diet Order:  Diet NPO time specified  Skin:  Wound (see comment) (Stage 2 sacral pressure injury)  Last BM:  8/25  Height:   Ht Readings from Last 1 Encounters:  07/14/16 5\' 8"  (1.727 m)    Weight:   Wt Readings from Last 1 Encounters:  07/15/16 156 lb 15.5 oz (71.2 kg)    Ideal Body Weight:  83.6 kg  BMI:   Body mass index is 23.87 kg/m.  Estimated Nutritional Needs:   Kcal:  1966  Protein:  79-99 grams (1.2-1.5 grams/kg)  Fluid:  >/= 2 L/day  EDUCATION NEEDS:   Education needs addressed    Jarome Matin, MS, RD, LDN Inpatient Clinical Dietitian Pager # [phone] After hours/weekend pager # [phone]

## 2016-07-15 NOTE — Progress Notes (Signed)
Notified MD of decreased UO throughout the night. No new orders given. Will continue to monitor.

## 2016-07-15 NOTE — Telephone Encounter (Signed)
CALLED PATIENT TO CONF APPTS PER 07/08/16 LOS. L/M APPT LTR AND SCHD MAILED. 07/15/16 °

## 2016-07-15 NOTE — Progress Notes (Signed)
Pharmacy Antibiotic Note  Andre Fernandez is a 60 y.o. male admitted on 07/04/2016.  He was called to return to ED for positive blood cultures obtained at ED on 8/23.  PMH includes metastatic colon cancer (cycle 1 FOLFOX chemo on 07/06/16), DVT on Xarelto, sacral ulcer, and US paracentesis on 07/06/16. Patient's currently on vancomycin for bacteremia (plan to treat for 2 weeks).  Cefepime restarted, but now changed to Zosyn for suspected aspiration.  Today, 07/15/2016: - Day # 8/14 vancomycin - S/p RR, intubation, and transfer to ICU 8/30 AM - Hypothermia, WBC improved to WNL, Scr inc to 2.73 - Lactic acid increased to 3.5 - PCT ~18  Plan:  Continue Zosyn 3.375g IV Q8H infused over 4hrs for now (as long as CrCl > 20)  Vancomycin random > 20 mcg/ml this am, continue to hold vancomycin dose for today.  Goal VT 15-20 mcg/mL.  Follow up renal fxn, culture results, and clinical course.  Height: 5\' 8"  (172.7 cm) Weight: 156 lb 15.5 oz (71.2 kg) IBW/kg (Calculated) : 68.4  Temp (24hrs), Avg:97.8 F (36.6 C), Min:96.8 F (36 C), Max:98.4 F (36.9 C)   Recent Labs Lab 07/02/2016 0834 06/17/2016 0837 07/10/16 0555 07/11/16 0500  07/12/16 1030 07/13/16 0827 07/13/16 1457 07/14/16 0340 07/14/16 0341 07/14/16 0356 07/14/16 1232 07/14/16 1610 07/15/16 0519 07/15/16 0615  WBC 20.4*  --  13.9* 26.1*  --  23.1*  --   --  6.0  --   --   --   --   --   --   CREATININE 0.92  --  0.83 0.85  --  1.30* 1.51*  --  1.88*  --   --   --   --   --  2.73*  LATICACIDVEN  --  2.74*  --   --   --   --   --   --   --   --  3.5* 5.1* 5.2*  --   --   VANCOTROUGH  --   --   --   --   < >  --   --  28*  --   --   --   --   --  24*  --   VANCORANDOM  --   --   --   --   --   --   --   --   --  19  --   --   --   --   --   < > = values in this interval not displayed.  Estimated Creatinine Clearance: 27.8 mL/min (by C-G formula based on SCr of 2.73 mg/dL).    No Known Allergies  Antimicrobials this  admission: 8/25 Cefepime >> 8/27, resumed 8/30 Cefepime >> 8/30 8/25 Vancomycin >>  8/27 Augmentin >>8/29 8/30 Zosyn >>  Dose adjustments this admission: 8/27: Vanc Trough at 2100 = 22 on 750mg  q12 : change to 500mg  IV q12h 8/29: VT @ 15:00 = 28 mcg/ml = Vanc on hold 8/30 Vrandom @ 0500: 19 mg/L, Give Vanc 500mg  x1  8/31 Vrandom @ 0600 (24 hr level)= 24 mcg/ml 9/1 Vrandom @ 0500  =   Microbiology results: 8/23 (From previous ED visit) BCx: 1/2 gram variable rod, bacillus species (aerobic bottle only), 1/2 NGF 8/23 BCID: NONE DETECTED 8/25 BCx x2: ngtd 8/25 urine: neg FINAL 8/28 BCx: ngtd 8/30 respiratory cxt: (Rare GPC pairs) pending 8/30 MRSA PCR: negative Thank you for allowing pharmacy to be a part of this patient's care.  Rachel Bo  Elby Showers, PharmD, BCPS.   Pager: [phone] 07/15/2016 9:27 AM

## 2016-07-15 NOTE — Progress Notes (Signed)
Chaplain and counseling intern introduced spiritual care as resource and provided brief support with family at bedside.   Pt has large extended family gathered.  They identify pt's daughter as HCPOA.  Mr Hamlyn is able to open eyes and connect with those at his bedside.  He was most alert when his granddaughter was in room.  Provided brief support with pt's brothers who requested prayers at bedside.      [place], [place]

## 2016-07-15 NOTE — Progress Notes (Signed)
PULMONARY / CRITICAL CARE MEDICINE   Name: Andre Fernandez MRN: [id number] DOB: 09-24-1956    ADMISSION DATE:  06/29/2016 CONSULTATION DATE:  07/14/2016  REFERRING MD:  Chaney Malling, N.P. 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 Patient was discharged approximately 2 weeks ago after having diverging left colostomy and completing one round of chemotherapy approximately 2 days prior to admission. Patient was subsequently admitted to the hospital 8/25 after having blood cultures done in the emergency department and started on empiric vancomycin and cefepime for bacteremia.Cultures speciated Bacillus which was thought to be a real infection given malignancy and immunosuppression. Vomiting in ED , XR abd s/o SBO, then developed acute hypercarbic /hypoxic resp failure requiring intubation   SUBJECTIVE:  Critically ill, on lower levo gtt, Wide awake Poor UO   VITAL SIGNS: BP (!) 89/68   Pulse (!) 118   Temp (!) 96.8 F (36 C) (Axillary)   Resp (!) 26   Ht 5\' 8"  (1.727 m)   Wt 156 lb 15.5 oz (71.2 kg)   SpO2 100%   BMI 23.87 kg/m   HEMODYNAMICS: CVP:  [8 mmHg-13 mmHg] 10 mmHg  VENTILATOR SETTINGS: Vent Mode: PRVC FiO2 (%):  [40 %] 40 % Set Rate:  [26 bmp] 26 bmp Vt Set:  [550 mL] 550 mL PEEP:  [5 cmH20] 5 cmH20 Plateau Pressure:  [24 cmH20-29 cmH20] 25 cmH20  INTAKE / OUTPUT: I/O last 3 completed shifts: In: 8049.5 [P.O.:340; I.V.:4959.5; Other:150; IV Piggyback:2600] Out: 255 [Urine:205; Stool:50]  PHYSICAL EXAMINATION: General:  Eyes open.hin, cachectic appearing male. Integument:  Warm & dry. No rash on exposed skin.  Lymphatics:  No appreciated cervical or supraclavicular lymphadenoapthy. HEENT:  Moist mucus membranes.No scleral injection or icterus.  Cardiovascular:regular rhythm. No appreciable JVD.  Pulmonary:  Coarse breath sounds  bilaterally with shallow breathing.no Accessory muscle use. Abdomen: firm. Normal bowel sounds. Ostomy pink Musculoskeletal:  Symmetrically decreased bulk and tone. No joint deformity or effusion appreciated. Neurological: Doesn't follow commands. Pupils symmetric. No meningismus.   LABS:  BMET  Recent Labs Lab 07/13/16 0827 07/14/16 0340 07/15/16 0615  NA 140 140 138  K 3.1* 3.6 3.5  CL 108 110 111  CO2 24 21* 16*  BUN 41* 46* 51*  CREATININE 1.51* 1.88* 2.73*  GLUCOSE 122* 139* 84    Electrolytes  Recent Labs Lab 07/03/2016 0830  07/13/16 0827 07/14/16 0340 07/15/16 0615  CALCIUM  --   < > 7.3* 7.0* 6.1*  MG 2.3  --   --  2.2  --   PHOS  --   --   --  4.0  --   < > = values in this interval not displayed.  CBC  Recent Labs Lab 07/11/16 0500 07/12/16 1030 07/14/16 0340  WBC 26.1* 23.1* 6.0  HGB 8.0* 8.3* 7.9*  HCT 23.7* 24.3* 24.2*  PLT 138* 78* 60*    Coag's  Recent Labs Lab 07/15/2016 0831  INR 4.53*    Sepsis Markers  Recent Labs Lab 07/14/16 0340 07/14/16 0356 07/14/16 1232 07/14/16 1610 07/15/16 0519  LATICACIDVEN  --  3.5* 5.1* 5.2*  --   PROCALCITON 6.83  --   --   --  17.98    ABG  Recent Labs Lab 07/14/16 0217 07/14/16 0400  PHART 7.109* 7.381  PCO2ART 75.5* 33.3  PO2ART 64.8* 312*    Liver Enzymes  Recent Labs  Lab 06/23/2016 0834 07/10/16 0555 07/14/16 0340  AST 53* 45* 35  ALT 35 33 19  ALKPHOS 1,243* 1,151* 567*  BILITOT 1.0 0.9 1.7*  ALBUMIN 1.7* 1.8* 1.5*    Cardiac Enzymes  Recent Labs Lab 07/14/16 0340 07/14/16 1232 07/14/16 1610  TROPONINI 0.04* 0.04* 0.04*    Glucose  Recent Labs Lab 07/13/16 0740 07/14/16 1151 07/15/16 0731  GLUCAP 122* 115* 72    Imaging Dg Chest Port 1 View  Result Date: 07/14/2016 CLINICAL DATA:  Central line placement EXAM: PORTABLE CHEST 1 VIEW COMPARISON:  Chest radiograph from earlier today. FINDINGS: Endotracheal tube tip is 1.5 cm above the carina. Right  subclavian central venous catheter terminates in the lower third of the superior vena cava. Left subclavian MediPort terminates at the cavoatrial junction. Stable cardiomediastinal silhouette with normal heart size. No pneumothorax. Small to moderate bilateral pleural effusions are stable. Fluffy parahilar opacities throughout both lungs appear stable. IMPRESSION: 1. No pneumothorax. Right subclavian central venous catheter appears well positioned with the tip in the lower third of the superior vena cava. 2. Endotracheal tube tip is 1.5 cm above the carina, consider retracting 0.5 - 1 cm. 3. Stable small to moderate bilateral pleural effusions. 4. Stable fluffy parahilar opacities throughout both lungs, favor noncardiogenic pulmonary edema/ARDS, cannot exclude multifocal pneumonia or diffuse alveolar hemorrhage. Electronically Signed   By: Ilona Sorrel M.D.   On: 07/14/2016 14:54     STUDIES:  Venous Duplex 06/30/16: Acute DVT in left posterior tibial vein mid-calf to ankle w/ "sluggish" flow throughout the entire lower leg. Also new forming thrombus behind a valve in the distal popliteal vein.   MICROBIOLOGY: Blood Ctx x2 8/23:  Bacillus species 1/2 bottles  Urine Ctx 8/25:  Negative Blood Ctx x2 8/25 >> Blood Ctx L CVL 8/28 >> resp 8/30 >> GPC >>  ANTIBIOTICS: Vancomycin 8/25 >> Cefepime 8/25 - 8/27;  Zosyn 8/30 >>  SIGNIFICANT EVENTS: 8/25 - Admit 8/30 - Transfer to ICU w/ worsening shock & hypercarbic respiratory failure  LINES/TUBES: OETT 7.5 8/30 >> L Chest [facility] 8/25 >> Rt [place] 8/30 >>  DISCUSSION:  60 y.o. 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 s/p chemotherapy 2 days prior to admission, with SBO, aspiration &   hypercarbic respiratory failure and septic shock - on lower pressors but oliguric  ASSESSMENT / PLAN:  PULMONARY A: Acute Hypercarbic Respiratory Failure Acute Hypoxic Respiratory Failure  Aspiration pneumonia P:   Full Vent Support SBTs once  off pressors  CARDIOVASCULAR A:  Septic shock H/O HTN Echo nml LV fn, no valve abn  P:  Levophed to maintain MAP >65 & SBP >90 Holding home anti-hypertensive medications  INFECTIOUS A:    Bacillus Species Bacteremia  P:   Ct zosyn, can stop vanc soon (note MRSA pcr neg) Trending Procalcitonin per algorithm   HEMATOLOGIC/ONCOLOGIC A:   Stage IV Colon Cancer - S/P 1 round of chemo 2 days prior to admit 8/25. Anemia - No signs of active bleeding. Thrombocytopenia LLE DVT - Noted on 8/16. On tx w/ Xarleto.  P:  Repeat CBC daily  Holding Xarelto - will need heparin once plts recover dc SCD from LLE  RENAL A:   AKI - Worsening. Oliguria Hypokalemia  P:   Trending UOP Checking electrolytes & renal function now Holding off on Foley Catheter Replacing electrolytes as indicated   GASTROINTESTINAL A:   Colon Cancer Stage IV - S/P diverting ostomy. H/O GERD  P:   NPO  Pepcid IV q12hr Unable to pass OGT - may need IR to place  ENDOCRINE A:   Hyperglycemia - No h/o DM. Mild.  P:   Monitoring serum glucose on daily labs.  NEUROLOGIC A:   Sedation on Ventilator Acute Encephalopathy - Multifactorial from respiratory failure & likely toxic metabolic from sepsis.   P:   RASS goal: 0 to -1 Versed IV prn Sedation Fentanyl IV prn Pain  FAMILY  - Updates:twin brother & Daughter updated  8/31  - Inter-disciplinary family meet or Palliative Care meeting due by: 9/5 Not a candidate for further chemo, Not a candidate for HD, should be a one way extubation if he reaches that point  My cc time x 35 m   Kara Mead MD. Shade Flood. Deputy Pulmonary & Critical care Pager [phone] If no response call [phone]     07/15/2016, 8:53 AM

## 2016-07-16 ENCOUNTER — Inpatient Hospital Stay (HOSPITAL_COMMUNITY): Payer: BLUE CROSS/BLUE SHIELD

## 2016-07-16 ENCOUNTER — Other Ambulatory Visit: Payer: BLUE CROSS/BLUE SHIELD

## 2016-07-16 ENCOUNTER — Ambulatory Visit: Payer: BLUE CROSS/BLUE SHIELD | Admitting: Nurse Practitioner

## 2016-07-16 DIAGNOSIS — Z9889 Other specified postprocedural states: Secondary | ICD-10-CM

## 2016-07-16 DIAGNOSIS — J9601 Acute respiratory failure with hypoxia: Secondary | ICD-10-CM

## 2016-07-16 DIAGNOSIS — J9690 Respiratory failure, unspecified, unspecified whether with hypoxia or hypercapnia: Secondary | ICD-10-CM

## 2016-07-16 LAB — BASIC METABOLIC PANEL
ANION GAP: 11 (ref 5–15)
BUN: 59 mg/dL — ABNORMAL HIGH (ref 6–20)
CALCIUM: 5.9 mg/dL — AB (ref 8.9–10.3)
CO2: 16 mmol/L — ABNORMAL LOW (ref 22–32)
Chloride: 109 mmol/L (ref 101–111)
Creatinine, Ser: 3.3 mg/dL — ABNORMAL HIGH (ref 0.61–1.24)
GFR, EST AFRICAN AMERICAN: 22 mL/min — AB (ref >60)
GFR, EST NON AFRICAN AMERICAN: 19 mL/min — AB (ref >60)
GLUCOSE: 101 mg/dL — AB (ref 65–99)
Potassium: 3.8 mmol/L (ref 3.5–5.1)
SODIUM: 136 mmol/L (ref 135–145)

## 2016-07-16 LAB — GLUCOSE, SEROUS FLUID
GLUCOSE FL: 95 mg/dL
Glucose, Fluid: 83 mg/dL

## 2016-07-16 LAB — BODY FLUID CELL COUNT WITH DIFFERENTIAL
EOS FL: 8 %
Eos, Fluid: 2 %
LYMPHS FL: 15 %
LYMPHS FL: 34 %
MONOCYTE-MACROPHAGE-SEROUS FLUID: 18 % — AB (ref 50–90)
Monocyte-Macrophage-Serous Fluid: 28 % — ABNORMAL LOW (ref 50–90)
Neutrophil Count, Fluid: 49 % — ABNORMAL HIGH (ref 0–25)
Neutrophil Count, Fluid: 46 % — ABNORMAL HIGH (ref 0–25)
Total Nucleated Cell Count, Fluid: 249 cu mm (ref 0–1000)
Total Nucleated Cell Count, Fluid: 197 cu mm (ref 0–1000)

## 2016-07-16 LAB — CULTURE, RESPIRATORY

## 2016-07-16 LAB — CBC WITH DIFFERENTIAL/PLATELET
BASOS ABS: 0 10*3/uL (ref 0.0–0.1)
Basophils Relative: 0 %
EOS ABS: 0.7 10*3/uL (ref 0.0–0.7)
Eosinophils Relative: 8 %
HCT: 20.4 % — ABNORMAL LOW (ref 39.0–52.0)
Hemoglobin: 7.2 g/dL — ABNORMAL LOW (ref 13.0–17.0)
Lymphocytes Relative: 3 %
Lymphs Abs: 0.3 10*3/uL — ABNORMAL LOW (ref 0.7–4.0)
MCH: 26.5 pg (ref 26.0–34.0)
MCHC: 35.3 g/dL (ref 30.0–36.0)
MCV: 75 fL — ABNORMAL LOW (ref 78.0–100.0)
MONO ABS: 0.2 10*3/uL (ref 0.1–1.0)
Monocytes Relative: 2 %
NEUTROS PCT: 87 %
Neutro Abs: 7.1 10*3/uL (ref 1.7–7.7)
PLATELETS: 34 10*3/uL — AB (ref 150–400)
RBC: 2.72 MIL/uL — AB (ref 4.22–5.81)
RDW: 20.8 % — AB (ref 11.5–15.5)
WBC: 8.3 10*3/uL (ref 4.0–10.5)

## 2016-07-16 LAB — PROTEIN, BODY FLUID

## 2016-07-16 LAB — LACTATE DEHYDROGENASE, PLEURAL OR PERITONEAL FLUID
LD FL: 357 U/L — AB (ref 3–23)
LD, Fluid: 231 U/L — ABNORMAL HIGH (ref 3–23)

## 2016-07-16 LAB — CORTISOL: Cortisol, Plasma: 26.6 ug/dL

## 2016-07-16 LAB — VANCOMYCIN, RANDOM: VANCOMYCIN RM: 23

## 2016-07-16 LAB — CULTURE, RESPIRATORY W GRAM STAIN

## 2016-07-16 LAB — PROCALCITONIN: PROCALCITONIN: 23.98 ng/mL

## 2016-07-16 MED ORDER — ALBUTEROL SULFATE (2.5 MG/3ML) 0.083% IN NEBU: 2.5000 mg | INHALATION_SOLUTION | RESPIRATORY_TRACT | Status: DC | PRN

## 2016-07-16 MED ORDER — SODIUM CHLORIDE 0.9 % IV SOLN
Freq: Once | INTRAVENOUS | Status: AC
  Administered 2016-07-16: 13:00:00 via INTRAVENOUS

## 2016-07-16 MED ORDER — SODIUM BICARBONATE 8.4 % IV SOLN
INTRAVENOUS | Status: DC
  Administered 2016-07-16 – 2016-07-17 (×3): via INTRAVENOUS
  Filled 2016-07-16 (×3): qty 150

## 2016-07-16 MED ORDER — SODIUM CHLORIDE 0.9 % IV SOLN
2.0000 g | Freq: Once | INTRAVENOUS | Status: AC
  Administered 2016-07-16: 2 g via INTRAVENOUS
  Filled 2016-07-16: qty 20

## 2016-07-16 NOTE — Progress Notes (Signed)
   07/16/16 1500  Clinical Encounter Type  Visited With Family  Visit Type Follow-up;Psychological support;Spiritual support;Critical Care  Spiritual Encounters  Spiritual Needs Emotional;Other (Comment);Grief support Arboriculturist)  Stress Factors  Patient Stress Factors Not reviewed  Family Stress Factors Loss;Lack of knowledge;Major life changes   I followed up with the family who were in the waiting room. The patient's daughter was tearful and continuing to deal with grief. The patient's twin brother has continued to be tearful.  The patient's daughter stated that she understood that the staff were doing a procedure on the patient. They requested prayer once again with family once the patient finishes with the procedure.  I gave them a prayer shawl to place on the patient.  Mr. Capel family have good support and a large family present. I was informed that more family would be coming from [place] today. A comfort cart was ordered just to make the family more comfortable in the waiting room, not due to EOL issues.  Please, contact Spiritual Care for further assistance.   [place] M.Div.

## 2016-07-16 NOTE — Progress Notes (Signed)
PULMONARY / CRITICAL CARE MEDICINE   Name: Andre Fernandez MRN: [id number] DOB: 1956/01/19    ADMISSION DATE:  07/03/2016 CONSULTATION DATE:  07/14/2016  REFERRING MD:  Chaney Malling, N.P. 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 Patient was discharged approximately 2 weeks ago after having diverging left colostomy and completing one round of chemotherapy approximately 2 days prior to admission. Patient was subsequently admitted to the hospital 8/25 after having blood cultures done in the emergency department and started on empiric vancomycin and cefepime for bacteremia.Cultures speciated Bacillus which was thought to be a real infection given malignancy and immunosuppression. Vomiting in ED , XR abd s/o SBO, then developed acute hypercarbic /hypoxic resp failure requiring intubation   SUBJECTIVE:  Critically ill, on lower levo gtt, Wide awake Poor UO   VITAL SIGNS: BP 100/71   Pulse (!) 131   Temp 99.1 F (37.3 C) (Axillary)   Resp (!) 22   Ht 5\' 8"  (1.727 m)   Wt 156 lb 15.5 oz (71.2 kg)   SpO2 94%   BMI 23.87 kg/m   HEMODYNAMICS: CVP:  [8 mmHg-13 mmHg] 12 mmHg  VENTILATOR SETTINGS: Vent Mode: PSV;CPAP FiO2 (%):  [40 %] 40 % Set Rate:  [26 bmp] 26 bmp Vt Set:  [550 mL] 550 mL PEEP:  [5 cmH20] 5 cmH20 Pressure Support:  [5 cmH20] 5 cmH20 Plateau Pressure:  [15 cmH20-21 cmH20] 20 cmH20  INTAKE / OUTPUT: I/O last 3 completed shifts: In: 3735.6 [I.V.:3095.6; Other:90; IV Piggyback:550] Out: 435 [Urine:135; Emesis/NG output:150; Stool:150]  PHYSICAL EXAMINATION: General:  Eyes open  cachectic appearing male. Integument:  Warm & dry. No rash on exposed skin.  HEENT:  Moist mucus membranes.No scleral injection or icterus. Orally intubated Cardiovascular:regular rhythm. No appreciable JVD.  Pulmonary:  Decreased breath sounds in bases Abdomen:  firm. Normal bowel sounds. Ostomy pink Musculoskeletal:  Symmetrically decreased bulk and tone. No joint deformity or effusion appreciated. Neurological:  follow commands. Pupils symmetric. No meningismus.   LABS:  BMET  Recent Labs Lab 07/14/16 0340 07/15/16 0615 07/16/16 0409  NA 140 138 136  K 3.6 3.5 3.8  CL 110 111 109  CO2 21* 16* 16*  BUN 46* 51* 59*  CREATININE 1.88* 2.73* 3.30*  GLUCOSE 139* 84 101*    Electrolytes  Recent Labs Lab 07/14/16 0340 07/15/16 0615 07/16/16 0409  CALCIUM 7.0* 6.1* 5.9*  MG 2.2  --   --   PHOS 4.0  --   --     CBC  Recent Labs Lab 07/12/16 1030 07/14/16 0340 07/16/16 0409  WBC 23.1* 6.0 8.3  HGB 8.3* 7.9* 7.2*  HCT 24.3* 24.2* 20.4*  PLT 78* 60* 34*    Coag's No results for input(s): APTT, INR in the last 168 hours.  Sepsis Markers  Recent Labs Lab 07/14/16 0340 07/14/16 0356 07/14/16 1232 07/14/16 1610 07/15/16 0519 07/16/16 0409  LATICACIDVEN  --  3.5* 5.1* 5.2*  --   --   PROCALCITON 6.83  --   --   --  17.98 23.98    ABG  Recent Labs Lab 07/14/16 0217 07/14/16 0400  PHART 7.109* 7.381  PCO2ART 75.5* 33.3  PO2ART 64.8* 312*    Liver Enzymes  Recent Labs Lab 07/10/16 0555 07/14/16 0340  AST 45* 35  ALT 33 19  ALKPHOS 1,151* 567*  BILITOT 0.9 1.7*  ALBUMIN 1.8* 1.5*  Cardiac Enzymes  Recent Labs Lab 07/14/16 0340 07/14/16 1232 07/14/16 1610  TROPONINI 0.04* 0.04* 0.04*    Glucose  Recent Labs Lab 07/13/16 0740 07/14/16 1151 07/15/16 0731  GLUCAP 122* 115* 72    Imaging Dg Chest Port 1 View  Result Date: 07/16/2016 CLINICAL DATA:  Respiratory failure . EXAM: PORTABLE CHEST 1 VIEW COMPARISON:  07/14/2016 . FINDINGS: Endotracheal tube, NG tube, right subclavian central line, left power port catheter in stable position. Heart size normal. Unchanged diffuse bilateral pulmonary infiltrates and or edema. Unchanged prominent bilateral pleural effusions. No pneumothorax.  IMPRESSION: 1. Lines and tubes in stable position. 2. Unchanged diffuse bilateral pulmonary infiltrates and or edema. Unchanged prominent bilateral pleural effusions. 3. Heart size normal. Electronically Signed   By: Marcello Moores  Register   On: 07/16/2016 06:54     STUDIES:  Venous Duplex 06/30/16: Acute DVT in left posterior tibial vein mid-calf to ankle w/ "sluggish" flow throughout the entire lower leg. Also new forming thrombus behind a valve in the distal popliteal vein.   MICROBIOLOGY: Blood Ctx x2 8/23:  Bacillus species 1/2 bottles  Urine Ctx 8/25:  Negative Blood Ctx x2 8/25 >> Blood Ctx L CVL 8/28 >> resp 8/30 >> GPC >>  ANTIBIOTICS: Vancomycin 8/25 >> Cefepime 8/25 - 8/27;  Zosyn 8/30 >>  SIGNIFICANT EVENTS: 8/25 - Admit 8/30 - Transfer to ICU w/ worsening shock & hypercarbic respiratory failure  LINES/TUBES: OETT 7.5 8/30 >> L Chest [facility] 8/25 >> Rt [place] 8/30 >>  DISCUSSION:  60 y.o. 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 s/p chemotherapy 2 days prior to admission. Admitted 8/25 w/ intractable nausea and vomiting as well as positive blood cultures. Course complicated by aspiration &  hypercarbic respiratory failure and septic shock - on lower pressors but still oliguric w/ rising creatinine. The immediate issues are on-going shock, persistent ventilator dependence and rising creatinine. I am concerned that in the big picture here and that he is dying of cancer.   ASSESSMENT / PLAN:  PULMONARY A: Acute Hypercarbic Respiratory Failure Acute Hypoxic Respiratory Failure  Aspiration pneumonia Bilateral effusions  ->CXR still w/ diffuse bilateral airspace disease c/w ALI and effusion P:   PSV as tolerated Will eval chest for possible diagnostic/therapeutic thora (would need platelets before we did this) Ideally start diuretics when off pressors.   CARDIOVASCULAR A:  Septic shock H/O HTN Echo nml LV fn, no valve abn -->CVP at goal P:  Levophed to  maintain MAP >65 & SBP >90 Holding home anti-hypertensive medications Ck cortisol-->consider stress dose steroids  INFECTIOUS A:    Bacillus Species Bacteremia Aspiration PNA -->PCT rising. He is on broad spec abx P:   Cont vanc and zosyn F/u pending culture data Consider repeat CT abd/pelvis  Trending Procalcitonin per algorithm   HEMATOLOGIC/ONCOLOGIC A:   Stage IV Colon Cancer - S/P 1 round of chemo 2 days prior to admit 8/25. Anemia - No signs of active bleeding. Thrombocytopenia (expected) LLE DVT - Noted on 8/16. On tx w/ Xarleto.  P:  Repeat CBC daily  Holding Xarelto - will need heparin once plts recover dc SCD from LLE  RENAL A:   AKI - Worsening. Metabolic acidosis (mixed AG and NAGMA) Oliguria Hypokalemia  P:   Trending UOP Checking electrolytes & renal function now Holding off on Foley Catheter Replacing electrolytes as indicated Add bicarb gtt  GASTROINTESTINAL A:   Colon Cancer Stage IV - S/P diverting ostomy. H/O GERD  P:   NPO  Pepcid IV q12hr Unable to pass OGT - may need IR to place  ENDOCRINE A:   Hyperglycemia - No h/o DM. Mild.  P:   Monitoring serum glucose on daily labs.  NEUROLOGIC A:   Sedation on Ventilator Acute Encephalopathy - Multifactorial from respiratory failure & likely toxic metabolic from sepsis-->improved  P:   RASS goal: 0 to -1 Versed IV prn Sedation Fentanyl IV prn Pain  FAMILY  - Updates:twin brother & Daughter updated  8/31  - Inter-disciplinary family meet or Palliative Care meeting due by: 9/5 Not a candidate for further chemo, Not a candidate for HD, should be a one way extubation if he reaches that point   Erick Colace ACNP-BC [place] Pager # [phone] OR # [phone] if no answer    07/16/2016, 9:54 AM  Felt short of breath with PS 5/5 >> tolerating 10/5.  Cachetic.  HR regular, tachycardic.  Decreased BS at bases.  Mild abd tenderness.  Decreased muscle  bulk.  CXR - b/l effusions  Hb 7.2, PLT 34, Creatinine 3.30, Procalcitonin 23.98  Assessment/plan:  Acute respiratory failure. B/l pleural effusions. - will try to optimize respiratory mechanics for extubation >> will need to d/w family goals of care; recommend he not be reintubated - will arrange for thoracentesis (possibly b/l) 9/01 - f/u CXR  Sepsis. Aspiration PNA. Bacillus species bacteremia. - continue Abx  Stage IV colon cancer. - doesn't appear he will be able to tolerate chemotherapy in current condition  AKI. - monitor renal fx - don't think he is a suitable candidate for renal replacement   Goals of care. - He is very weak, and will not likely be able to tolerate additional chemotherapy any time soon if ever - will need to d/w family about goals of care >> likely palliative approach and possible hospice assessment   Updated pt's twin brother at bedside.  CC time by me independent of APP time 32 minutes.  Chesley Mires, MD [facility] Pulmonary/Critical Care 07/16/2016, 11:12 AM Pager:  [phone] After 3pm call: [phone]

## 2016-07-16 NOTE — Progress Notes (Signed)
Pt family wishes to speak with [name] concerning pt prognosis. Family has many questions concerning pts progression. Will let day nurse know family wishes as well.

## 2016-07-16 NOTE — Progress Notes (Signed)
eLink Physician-Brief Progress Note Patient Name: Andre Fernandez DOB: 1956/07/12 MRN: [id number]   Date of Service  07/16/2016  HPI/Events of Note  Pneumothorax post thoracentesis on left On my review of images the pneumothorax is large and there is evidence of pleural thickening at the base on the left, so suspect pneumothorax ex-vacuo Nurse reports his condition has improved post thoracentesis  eICU Interventions  Monitor closely in ICU Repeat CXR at 2000 RN instructed to call immediately if change in vent synchrony or hypotension     Intervention Category Major Interventions: Procedure - evaluation and supervision  Simonne Maffucci 07/16/2016, 5:40 PM

## 2016-07-16 NOTE — Progress Notes (Signed)
Pharmacy Antibiotic Note  Andre Fernandez is a 60 y.o. male admitted on 07/06/2016 with bacteremia.  Pharmacy has been consulted for Vancomycin dosing.  Vancomycin random 9/1 AM still > 20, no additional vancomycin charted.  Plan: Continue to hold vancomycin, redose when/if level < 20. Recheck level with 9/2 AM labs  Height: 5\' 8"  (172.7 cm) Weight: 156 lb 15.5 oz (71.2 kg) IBW/kg (Calculated) : 68.4  Temp (24hrs), Avg:97.7 F (36.5 C), Min:96.5 F (35.8 C), Max:99.1 F (37.3 C)   Recent Labs Lab 06/17/2016 0837 07/10/16 0555 07/11/16 0500  07/12/16 1030 07/13/16 0827 07/13/16 1457 07/14/16 0340 07/14/16 0341 07/14/16 0356 07/14/16 1232 07/14/16 1610 07/15/16 0519 07/15/16 0615 07/16/16 0409  WBC  --  13.9* 26.1*  --  23.1*  --   --  6.0  --   --   --   --   --   --  8.3  CREATININE  --  0.83 0.85  --  1.30* 1.51*  --  1.88*  --   --   --   --   --  2.73* 3.30*  LATICACIDVEN 2.74*  --   --   --   --   --   --   --   --  3.5* 5.1* 5.2*  --   --   --   VANCOTROUGH  --   --   --   < >  --   --  28*  --   --   --   --   --  24*  --   --   VANCORANDOM  --   --   --   --   --   --   --   --  19  --   --   --   --   --  23  < > = values in this interval not displayed.  Estimated Creatinine Clearance: 23 mL/min (by C-G formula based on SCr of 3.3 mg/dL).    No Known Allergies  Antimicrobials this admission: 8/25 Cefepime >> 8/27, resumed 8/30 Cefepime >> 8/30 8/25 Vancomycin >>  8/27 Augmentin >>8/29 8/30 Zosyn >>  Dose adjustments this admission: 8/27: Vanc Trough at 2100 = 22 on 750mg  q12 : change to 500mg  IV q12h 8/29: VT @ 15:00 = 28 mcg/ml = Vanc on hold 8/30 Vrandom @ 0500: 19 mg/L, Give Vanc 500mg  x1  8/31 Vrandom @ 0600 (24 hr level)= 24 mcg/ml 9/1 Vrandom @ 0500  = 23   Microbiology results: 8/23 (From previous ED visit) BCx: 1/2 gram variable rod, bacillus species (aerobic bottle only), 1/2 NGF 8/23 BCID: NONE DETECTED 8/25 BCx x2: ngtd 8/25 urine: neg  FINAL 8/28 BCx: ngtd 8/30 respiratory cxt: (Rare GPC pairs) pending 8/30 MRSA PCR: negative  Thank you for allowing pharmacy to be a part of this patient's care.  Nani Skillern Crowford 07/16/2016 6:22 AM

## 2016-07-16 NOTE — Procedures (Signed)
Thoracentesis Procedure Note  Pre-operative Diagnosis: right sided effusion   Post-operative Diagnosis: same  Indications: evaluation of pleural fluid and vent weaning  Procedure Details  Consent: Informed consent was obtained. Risks of the procedure were discussed including: infection, bleeding, pain, pneumothorax.  Under sterile conditions the patient was positioned. Betadine solution and sterile drapes were utilized.  1% buffered lidocaine was used to anesthetize the  rib space which was identified via real time [place]. Fluid was obtained without any difficulties and minimal blood loss.  A dressing was applied to the wound and wound care instructions were provided.   Findings 1500 ml of concentrated blood tinged pleural fluid was obtained. A sample was sent to Pathology for cytogenetics, flow, and cell counts, as well as for infection analysis.  Complications:  None; patient tolerated the procedure well.          Condition: stable  Plan A follow up chest x-ray was ordered.  Erick Colace ACNP-BC [place] Pager # [phone] OR # [phone] if no answer

## 2016-07-16 NOTE — Progress Notes (Signed)
eLink Physician-Brief Progress Note Patient Name: Andre Fernandez DOB: 19-Nov-1955 MRN: [id number]   Date of Service  07/16/2016  HPI/Events of Note  Hypocalcemia  eICU Interventions  Calcium replaced     Intervention Category Intermediate Interventions: Electrolyte abnormality - evaluation and management  [name] 07/16/2016, 5:02 AM

## 2016-07-16 NOTE — Progress Notes (Signed)
IP PROGRESS NOTE  Subjective:   He remains on the ventilator. Denies pain.    Objective: Vital signs in last 24 hours: Blood pressure 100/71, pulse (!) 131, temperature 99.1 F (37.3 C), temperature source Axillary, resp. rate (!) 22, height 5\' 8"  (1.727 m), weight 156 lb 15.5 oz (71.2 kg), SpO2 94 %.  Intake/Output from previous day: 08/31 0701 - 09/01 0700 In: 2171.5 [I.V.:1971.5; IV Piggyback:200] Out: 320 [Urine:70; Emesis/NG output:150; Stool:100]  Physical Exam: Intubated on the ventilator.Alert and follows simple commands.  Lungs: moves air bilaterrally Cardiac: rrr, tachycardia Abdomen: distended, firm, small amount of liquid stool in ostomy bag Vascular: Pitting edema at the left greater than right lower leg    Portacath/PICC-without erythema  Lab Results:  Recent Labs  07/14/16 0340 07/16/16 0409  WBC 6.0 8.3  HGB 7.9* 7.2*  HCT 24.2* 20.4*  PLT 60* 34*  ANC 7.1  BMET  Recent Labs  07/15/16 0615 07/16/16 0409  NA 138 136  K 3.5 3.8  CL 111 109  CO2 16* 16*  GLUCOSE 84 101*  BUN 51* 59*  CREATININE 2.73* 3.30*  CALCIUM 6.1* 5.9*    Studies/Results: Dg Chest Port 1 View  Result Date: 07/16/2016 CLINICAL DATA:  Respiratory failure . EXAM: PORTABLE CHEST 1 VIEW COMPARISON:  07/14/2016 . FINDINGS: Endotracheal tube, NG tube, right subclavian central line, left power port catheter in stable position. Heart size normal. Unchanged diffuse bilateral pulmonary infiltrates and or edema. Unchanged prominent bilateral pleural effusions. No pneumothorax. IMPRESSION: 1. Lines and tubes in stable position. 2. Unchanged diffuse bilateral pulmonary infiltrates and or edema. Unchanged prominent bilateral pleural effusions. 3. Heart size normal. Electronically Signed   By: Marcello Moores  Register   On: 07/16/2016 06:54   Dg Chest Port 1 View  Result Date: 07/14/2016 CLINICAL DATA:  Central line placement EXAM: PORTABLE CHEST 1 VIEW COMPARISON:  Chest radiograph from  earlier today. FINDINGS: Endotracheal tube tip is 1.5 cm above the carina. Right subclavian central venous catheter terminates in the lower third of the superior vena cava. Left subclavian MediPort terminates at the cavoatrial junction. Stable cardiomediastinal silhouette with normal heart size. No pneumothorax. Small to moderate bilateral pleural effusions are stable. Fluffy parahilar opacities throughout both lungs appear stable. IMPRESSION: 1. No pneumothorax. Right subclavian central venous catheter appears well positioned with the tip in the lower third of the superior vena cava. 2. Endotracheal tube tip is 1.5 cm above the carina, consider retracting 0.5 - 1 cm. 3. Stable small to moderate bilateral pleural effusions. 4. Stable fluffy parahilar opacities throughout both lungs, favor noncardiogenic pulmonary edema/ARDS, cannot exclude multifocal pneumonia or diffuse alveolar hemorrhage. Electronically Signed   By: Ilona Sorrel M.D.   On: 07/14/2016 14:54    Medications: I have reviewed the patient's current medications.  Assessment/Plan: 1. 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 CTs of the chest, abdomen, and pelvis on 06/07/2016 revealed mediastinal lymphadenopathy, lung nodules, liver metastases, a mass that appeared to be centered at the sigmoid colon with surrounding lymphadenopathy and peritoneal implants ? 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 CT chest 06/30/2016-moderate bilateral pleural effusions, ascites, progressive lung and liver metastases ? Cycle 1 Folfox  07/06/16  2. Microcytic anemia  3. Pain secondary to #1  4. Fever  5. Tachycardia, no apparent explanation, referred for a stat chest CT 06/30/2016-negative for pulmonary embolism  6.  left lower extremity DVT confirmed on Doppler 06/30/2016-Xarelto  started 06/30/2016  7.    Oral candidiasis-treated with Diflucan 07/02/2016  8.   Hypercalcemia- s/p zometa 8/22  9.   Respiratory Failure 8/30- Aspiration?  10.  Sepsis Syndrome- Bacillus bacteremia  11.  Acute renal failure  12. Thrombocytopenia secondary to chemotherapy and sepsis  Mr. Mcnemar remains on the ventilator in the setting of sepsis/respiratory failure. He has advanced metastatic colon cancer. He has multiorgan failure and I suspect he has a tumor related partial bowel obstruction.  I had a long discussion with multiple family members regarding the current situation, his prognosis, and treatment options. They understand it is likely he will not survive this hospital admission and very unlikely he will be able to receive further treatment for the cancer. They had many questions about clinical trials and second opinions. I told him I would be glad to refer him for a second opinion if he recovers from the acute illness. Hopefully he will be able to come off of the ventilator.    Recommendation: 1. Management of resp. Failure and sepsis per CCM 2. Monitor platletes and transfuse for less than 10,000 3. Hold anticoagulation until the platelet count recovers 4. Discussion with patient regarding CODE STATUS and goals of care when he is off of the ventilator.  Please call Oncology as needed over the weekend. I will see him 07/19/2016.              LOS: 7 days   Betsy Coder, MD   07/16/2016, 9:37 AM

## 2016-07-16 NOTE — Progress Notes (Signed)
Nutrition Follow-up  DOCUMENTATION CODES:   Severe malnutrition in context of acute illness/injury  INTERVENTION:  - RD will follow-up 9/3.  NUTRITION DIAGNOSIS:   Malnutrition related to acute illness as evidenced by severe depletion of body fat, severe depletion of muscle mass. -ongoing  GOAL:   Patient will meet greater than or equal to 90% of their needs -unmet, unable to meet.   MONITOR:   Vent status, Weight trends, Labs, Skin, I & O's  ASSESSMENT:   60 y.o. 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 [name] from oncology and has started chemotherapy with round 1 which was about 2 days ago. He presented to the emergency room yesterday due to intractable nausea and vomiting, and improvement in the ED and was sent home.  9/1 OGT to suction with dark drainage present in tubing and scant amount in canister. Spoke with RN at bedside who reports that no other drainage was present from this shift. Spoke with CCM NP concerning POC and nutrition; will follow-up 9/3 to further assess GOC and decisions regarding nutrition support. No new weight since yesterday. Estimated kcal need updated and continue to use weight from 8/30 (66.2 kg). MAP: 86 at time of visit; goal of >65 per CCM notes.   Patient is currently intubated on ventilator support MV: 9.5 L/min Temp (24hrs), Avg:98 F (36.7 C), Min:96.5 F (35.8 C), Max:99.1 F (37.3 C) Propofol: none  Medications reviewed; 2 g IV Ca gluconate x1 dose today, 4 mg IV Zofran QID, PRN IV Phenergan,  Labs reviewed; BUN: 59 mg/dL, creatinine: 3.3 mg/dL, Ca: 5.9 mg/dL, GFR: 22 mL/min.  IVF: D5-150 mEq sodium bicarb @ 75 mL/hr (306 kcal).  Drip: Levo @ 5 mcg/min.    8/31 - OGT placed shortly before RD visit.  - No drainage present in canister at  that time but dark green drainage present in tubing.  - Spoke with CCM MD who reports pt with SBO and plan at this time is for decompression x1-2 days.  - Estimated kcal need updated this AM and weight from yesterday (66.2 kg) as weight is +5 kg since yesterday.  - Per CCM MD note this AM, Levo with goal of MAP >65, patient is not a candidate for further chemo, and is not a candidate for HD.   Patient is currently intubated on ventilator support MV: 16.6 L/min Temp (24hrs), Avg:97.8 F (36.6 C), Max:98.4 F (36.9 C) IVF: D5-1/2 NS @ 75 mL/hr (306 kcal).  Drips: Levo @ 6 mcg/min.   8/30 - Rapid response called overnight after pt found to be unresponsive.  - Pt was transferred to the ICU and subsequently intubated.  - Estimated nutrition needs updated based on intubation. - Per chart review, pt previously on Regular diet with documented 50% of lunch 8/27; 25% of lunch and 100% of dinner on 8/29 and no other recent meal intakes documented.  - Pt likely at risk for refeeding should TF be initiated given poor appetite and variable intakes.   Patient is currently intubated on ventilator support MV: 15.1L/min Temp (24hrs), Avg:98 F (36.7 C), Max:98.8 F (37.1 C) IVF: D5-1/2 NS @ 75 mL/hr (306 kcal).  Drip:Levo @ 8 mcg/min.     Diet Order:  Diet NPO time specified  Skin:  Wound (see comment) (Stage 2 sacral pressure injury)  Last BM:  8/25  Height:   Ht Readings from Last 1 Encounters:  07/14/16 5\' 8"  (1.727 m)    Weight:   Wt Readings from Last 1 Encounters:  07/15/16 156 lb 15.5 oz (71.2 kg)    Ideal Body Weight:  83.6 kg  BMI:  Body mass index is 23.87 kg/m.  Estimated Nutritional Needs:   Kcal:  [id number]  Protein:  79-99 grams (1.2-1.5 grams/kg)  Fluid:  >/= 2 L/day  EDUCATION NEEDS:   Education needs addressed    Jarome Matin, MS, RD, LDN Inpatient Clinical Dietitian Pager # [phone] After hours/weekend pager # [phone]

## 2016-07-16 NOTE — Procedures (Signed)
Thoracentesis Procedure Note  Pre-operative Diagnosis: left pleural effusion   Post-operative Diagnosis: same  Indications: ventilator weaning and fluid analysis   Procedure Details  Consent: Informed consent was obtained. Risks of the procedure were discussed including: infection, bleeding, pain, pneumothorax.  Under sterile conditions the patient was positioned. Betadine solution and sterile drapes were utilized.  1% buffered lidocaine was used to anesthetize the  rib space which was identified via real time [place]. Fluid was obtained without any difficulties and minimal blood loss.  A dressing was applied to the wound and wound care instructions were provided.   Findings 1500 ml of blood tinged pleural fluid was obtained. A sample was sent to Pathology for cytogenetics, flow, and cell counts, as well as for infection analysis.  Complications:  None; patient tolerated the procedure well.          Condition: stable  Plan A follow up chest x-ray was ordered.   Erick Colace ACNP-BC [place] Pager # [phone] OR # [phone] if no answer

## 2016-07-16 DEATH — deceased

## 2016-07-17 ENCOUNTER — Inpatient Hospital Stay (HOSPITAL_COMMUNITY): Payer: BLUE CROSS/BLUE SHIELD

## 2016-07-17 DIAGNOSIS — Z7189 Other specified counseling: Secondary | ICD-10-CM

## 2016-07-17 DIAGNOSIS — Z515 Encounter for palliative care: Secondary | ICD-10-CM

## 2016-07-17 LAB — CULTURE, BLOOD (SINGLE): Culture: NO GROWTH

## 2016-07-17 LAB — CBC
HEMATOCRIT: 20 % — AB (ref 39.0–52.0)
Hemoglobin: 6.9 g/dL — CL (ref 13.0–17.0)
MCH: 25.7 pg — AB (ref 26.0–34.0)
MCHC: 34.5 g/dL (ref 30.0–36.0)
MCV: 74.3 fL — AB (ref 78.0–100.0)
Platelets: 103 10*3/uL — ABNORMAL LOW (ref 150–400)
RBC: 2.69 MIL/uL — ABNORMAL LOW (ref 4.22–5.81)
RDW: 20.9 % — AB (ref 11.5–15.5)
WBC: 3 10*3/uL — ABNORMAL LOW (ref 4.0–10.5)

## 2016-07-17 LAB — BASIC METABOLIC PANEL
Anion gap: 13 (ref 5–15)
BUN: 65 mg/dL — AB (ref 6–20)
CO2: 18 mmol/L — ABNORMAL LOW (ref 22–32)
Calcium: 5.8 mg/dL — CL (ref 8.9–10.3)
Chloride: 105 mmol/L (ref 101–111)
Creatinine, Ser: 3.96 mg/dL — ABNORMAL HIGH (ref 0.61–1.24)
GFR calc Af Amer: 18 mL/min — ABNORMAL LOW (ref >60)
GFR, EST NON AFRICAN AMERICAN: 15 mL/min — AB (ref >60)
GLUCOSE: 90 mg/dL (ref 65–99)
POTASSIUM: 3.9 mmol/L (ref 3.5–5.1)
Sodium: 136 mmol/L (ref 135–145)

## 2016-07-17 LAB — PREPARE RBC (CROSSMATCH)

## 2016-07-17 LAB — VANCOMYCIN, TROUGH: VANCOMYCIN TR: 21 ug/mL — AB (ref 15–20)

## 2016-07-17 MED ORDER — POLYVINYL ALCOHOL 1.4 % OP SOLN
1.0000 [drp] | Freq: Four times a day (QID) | OPHTHALMIC | Status: DC | PRN
  Filled 2016-07-17: qty 15

## 2016-07-17 MED ORDER — ACETAMINOPHEN 650 MG RE SUPP: 650.0000 mg | Freq: Four times a day (QID) | RECTAL | Status: DC | PRN

## 2016-07-17 MED ORDER — SODIUM CHLORIDE 0.9 % IV SOLN: Freq: Once | INTRAVENOUS | Status: DC

## 2016-07-17 MED ORDER — PIPERACILLIN-TAZOBACTAM IN DEX 2-0.25 GM/50ML IV SOLN
2.2500 g | Freq: Four times a day (QID) | INTRAVENOUS | Status: DC
  Administered 2016-07-17: 2.25 g via INTRAVENOUS
  Filled 2016-07-17 (×2): qty 50

## 2016-07-17 MED ORDER — LORAZEPAM 1 MG PO TABS: 1.0000 mg | ORAL_TABLET | ORAL | Status: DC | PRN

## 2016-07-17 MED ORDER — LORAZEPAM 2 MG/ML PO CONC: 1.0000 mg | ORAL | Status: DC | PRN

## 2016-07-17 MED ORDER — BIOTENE DRY MOUTH MT LIQD: 15.0000 mL | OROMUCOSAL | Status: DC | PRN

## 2016-07-17 MED ORDER — GLYCOPYRROLATE 0.2 MG/ML IJ SOLN
0.2000 mg | INTRAMUSCULAR | Status: DC | PRN
  Filled 2016-07-17: qty 1

## 2016-07-17 MED ORDER — GLYCOPYRROLATE 1 MG PO TABS
1.0000 mg | ORAL_TABLET | ORAL | Status: DC | PRN
  Filled 2016-07-17: qty 1

## 2016-07-17 MED ORDER — HYDROMORPHONE HCL 1 MG/ML IJ SOLN
0.5000 mg | INTRAMUSCULAR | Status: DC | PRN
  Administered 2016-07-17: 0.1 mg via INTRAVENOUS
  Filled 2016-07-17: qty 1

## 2016-07-17 MED ORDER — HALOPERIDOL LACTATE 5 MG/ML IJ SOLN: 0.5000 mg | INTRAMUSCULAR | Status: DC | PRN

## 2016-07-17 MED ORDER — LORAZEPAM 2 MG/ML IJ SOLN: 1.0000 mg | INTRAMUSCULAR | Status: DC | PRN

## 2016-07-17 MED ORDER — ONDANSETRON 4 MG PO TBDP: 4.0000 mg | ORAL_TABLET | Freq: Four times a day (QID) | ORAL | Status: DC | PRN

## 2016-07-17 MED ORDER — ONDANSETRON HCL 4 MG/2ML IJ SOLN: 4.0000 mg | Freq: Four times a day (QID) | INTRAMUSCULAR | Status: DC | PRN

## 2016-07-17 MED ORDER — ACETAMINOPHEN 325 MG PO TABS: 650.0000 mg | ORAL_TABLET | Freq: Four times a day (QID) | ORAL | Status: DC | PRN

## 2016-07-17 MED ORDER — HALOPERIDOL 0.5 MG PO TABS
0.5000 mg | ORAL_TABLET | ORAL | Status: DC | PRN
  Filled 2016-07-17: qty 1

## 2016-07-17 MED ORDER — HALOPERIDOL LACTATE 2 MG/ML PO CONC
0.5000 mg | ORAL | Status: DC | PRN
  Filled 2016-07-17: qty 0.3

## 2016-07-19 LAB — TYPE AND SCREEN
ABO/RH(D): O POS
Antibody Screen: NEGATIVE
Unit division: 0

## 2016-07-19 LAB — PREPARE PLATELET PHERESIS: Unit division: 0

## 2016-07-20 LAB — BODY FLUID CULTURE
CULTURE: NO GROWTH
Culture: NO GROWTH
Special Requests: NORMAL
Special Requests: NORMAL

## 2016-07-21 ENCOUNTER — Ambulatory Visit: Payer: BLUE CROSS/BLUE SHIELD | Admitting: Nurse Practitioner

## 2016-07-21 ENCOUNTER — Other Ambulatory Visit: Payer: BLUE CROSS/BLUE SHIELD

## 2016-07-21 ENCOUNTER — Ambulatory Visit: Payer: BLUE CROSS/BLUE SHIELD

## 2016-07-22 ENCOUNTER — Telehealth: Payer: Self-pay

## 2016-07-22 LAB — CHOLESTEROL, BODY FLUID
Cholesterol, Fluid: 32 mg/dL
Cholesterol, Fluid: 35 mg/dL

## 2016-07-22 NOTE — Telephone Encounter (Signed)
On 07/22/2016 I received a death certificate from [employer] @ [facility]) (orginal). The death certificate is for burial. The patient is a patient of Doctor Sood. The death certificate will be taken to [facility] (2300) this pm for signature.  On 08/16/16 I received the death certificate back from Doctor Carlyss. I got the death certificate ready and called Margaretmary Lombard at [facility] to let her know the death certificate is ready for pickup.

## 2016-07-27 ENCOUNTER — Ambulatory Visit: Payer: BLUE CROSS/BLUE SHIELD

## 2016-07-27 ENCOUNTER — Other Ambulatory Visit: Payer: BLUE CROSS/BLUE SHIELD

## 2016-07-27 ENCOUNTER — Ambulatory Visit: Payer: BLUE CROSS/BLUE SHIELD | Admitting: Nurse Practitioner

## 2016-08-15 NOTE — Progress Notes (Signed)
Pharmacy Antibiotic Note  Andre Fernandez is a 60 y.o. male admitted on 06/24/2016 with bacteremia.  Pharmacy has been consulted for Vancomycin dosing.  Plan: Continue to hold vancomycin Vancomycin random level 9/4 AM labs  Height: 5\' 8"  (172.7 cm) Weight: 156 lb 15.5 oz (71.2 kg) IBW/kg (Calculated) : 68.4  Temp (24hrs), Avg:97.5 F (36.4 C), Min:96.5 F (35.8 C), Max:98.7 F (37.1 C)   Recent Labs Lab 07/11/16 0500  07/12/16 1030 07/13/16 0827  07/14/16 0340 07/14/16 0341 07/14/16 0356 07/14/16 1232 07/14/16 1610 07/15/16 0519 07/15/16 0615 07/16/16 0409 09-Aug-2016 0500  WBC 26.1*  --  23.1*  --   --  6.0  --   --   --   --   --   --  8.3 3.0*  CREATININE 0.85  --  1.30* 1.51*  --  1.88*  --   --   --   --   --  2.73* 3.30* 3.96*  LATICACIDVEN  --   --   --   --   --   --   --  3.5* 5.1* 5.2*  --   --   --   --   VANCOTROUGH  --   < >  --   --   < >  --   --   --   --   --  24*  --   --  21*  VANCORANDOM  --   --   --   --   --   --  19  --   --   --   --   --  23  --   < > = values in this interval not displayed.  Estimated Creatinine Clearance: 19.2 mL/min (by C-G formula based on SCr of 3.96 mg/dL).    No Known Allergies  Antimicrobials this admission: 8/25 Cefepime >> 8/27, resumed 8/30 Cefepime >> 8/30 8/25 Vancomycin >> 8/27 Augmentin >>8/29 8/30 Zosyn >>  Dose adjustments this admission: 8/27: Vanc Trough at 2100 = 22 on 750mg  q12 : change to 500mg  IV q12h 8/29: VT @ 15:00 = 28 mcg/ml = Vanc on hold 8/30 Vrandom @ 0500: 19 mg/L, Give Vanc 500mg  x1  8/31 Vrandom @ 0600 (24 hr level)= 24 mcg/ml 9/1 Vrandom @ 0500 = 23 9/2 Vancomycin random @ 0500 = 21  Microbiology results: 8/23 (From previous ED visit) BCx: 1/2 gram variable rod, bacillus species (aerobic bottle only), 1/2 NGF 8/23 BCID: NONE DETECTED 8/25 BCx x2: ngtd 8/25 urine: neg FINAL 8/28 BCx: ngtd 8/30 respiratory cxt: (Rare GPC pairs) pending 8/30 MRSA PCR: negative  Thank you for  allowing pharmacy to be a part of this patient's care.  Nani Skillern Crowford 09-Aug-2016 6:20 AM

## 2016-08-15 NOTE — Procedures (Signed)
Extubation Procedure Note  Patient Details:   Name: Andre Fernandez DOB: 02/03/1956 MRN: [id number]   Airway Documentation:     Evaluation  O2 sats: stable throughout Complications: No apparent complications Patient did tolerate procedure well. Bilateral Breath Sounds: Clear, Diminished   Yes  [name] Royal 08-13-2016, 10:00 AM

## 2016-08-15 NOTE — Plan of Care (Signed)
Spoke with pt in presence of his daughter, ex wife, and brother.  Reviewed different options regarding goals of care.  He would like to continue current therapies.  He does not want to suffer, and realizes he might not be a candidate for additional chemotherapy.  If this is the case, then he also realizes that he would not survive.    They are reviewing options about whether to proceed with reintubation, and cardiac resuscitation.  I also explained to them that if his renal fx gets worse, he would not be a candidate for dialysis.  They would like to have palliative care consult to assist with discussions about goals of care, symptom management, and hospice assessment.  Time spent 22 minutes.  Chesley Mires, MD [facility] Pulmonary/Critical Care 08/12/2016, 10:19 AM Pager:  [phone] After 3pm call: [phone]

## 2016-08-15 NOTE — Discharge Summary (Signed)
Andre Fernandez was a 60 y.o. male admitted on 07/02/2016 with bacteremia with Bacillus species in blood culture.  He has hx of Stage IV colon cancer with liver mets and recently started chemotherapy.  Due to progress medical conditions he is not a candidate for additional chemotherapy.  He had vomiting and found to have SBO.  He developed hypoxia/hypercapnia and required intubation.  He was started on pressors for septic shock.  He was started on Abx for bactermia, and HCAP.  He was found to have acute DVT Lt posterior tibial vein.  Anticoagulation was held due to thrombocytopenia.  He had b/l thoracentesis >> this was complicated by small Lt PTX.  He was extubated on Aug 15, 2023.  Palliative care consulted to assess for hospice.  He had gradual decline in his respiratory status, became apneic and asystolic.  He was pronounced dead on 08/14/2016 at 1521.  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

## 2016-08-15 NOTE — Progress Notes (Signed)
PCCM PROGRESS NOTE  Andre Fernandez is a 60 y.o. male admitted on 06/25/2016 with bacteremia with Bacillus species in blood culture.  He has hx of Stage IV colon cancer with liver mets and recently started chemotherapy.  Due to progress medical conditions he is not a candidate for additional chemotherapy.  He had vomiting and found to have SBO.  He developed hypoxia/hypercapnia and required intubation.  He was started on pressors for septic shock.  He was started on Abx for bactermia, and HCAP.  He was found to have acute DVT Lt posterior tibial vein.  Anticoagulation was held due to thrombocytopenia.  He had b/l thoracentesis >> this was complicated by small Lt PTX.  He was extubated on 9/02.  Palliative care consulted to assess for hospice.  Vital signs BP 120/68   Pulse (!) 122   Temp 97.6 F (36.4 C) (Oral)   Resp (!) 33   Ht 5\' 8"  (1.727 m)   Wt 156 lb 15.5 oz (71.2 kg)   SpO2 100%   BMI 23.87 kg/m   General: cachectic Neuro: follows commands Cardiac: regular, tachycardic Chest: basilar crackles Abd: soft, mild tenderness Ext: decreased muscle bulk Skin: no rashes   CMP Latest Ref Rng & Units August 03, 2016 07/16/2016 07/15/2016  Glucose 65 - 99 mg/dL 90 101(H) 84  BUN 6 - 20 mg/dL 65(H) 59(H) 51(H)  Creatinine 0.61 - 1.24 mg/dL 3.96(H) 3.30(H) 2.73(H)  Sodium 135 - 145 mmol/L 136 136 138  Potassium 3.5 - 5.1 mmol/L 3.9 3.8 3.5  Chloride 101 - 111 mmol/L 105 109 111  CO2 22 - 32 mmol/L 18(L) 16(L) 16(L)  Calcium 8.9 - 10.3 mg/dL 5.8(LL) 5.9(LL) 6.1(LL)  Total Protein 6.5 - 8.1 g/dL - - -  Total Bilirubin 0.3 - 1.2 mg/dL - - -  Alkaline Phos 38 - 126 U/L - - -  AST 15 - 41 U/L - - -  ALT 17 - 63 U/L - - -    CBC Latest Ref Rng & Units 2016/08/03 07/16/2016 07/14/2016  WBC 4.0 - 10.5 K/uL 3.0(L) 8.3 6.0  Hemoglobin 13.0 - 17.0 g/dL 6.9(LL) 7.2(L) 7.9(L)  Hematocrit 39.0 - 52.0 % 20.0(L) 20.4(L) 24.2(L)  Platelets 150 - 400 K/uL 103(L) 34(L) 60(L)    Dg Chest Port 1 View  Result  Date: 08-03-16 CLINICAL DATA:  Followup left-sided pneumothorax. EXAM: PORTABLE CHEST 1 VIEW COMPARISON:  Earlier film, same date. FINDINGS: The endotracheal tube is 2.5 cm above the carina. The left subclavian Port-A-Cath is stable and the right subclavian center venous catheter is stable. The NG tube is stable. Persistent bilateral asymmetric airspace process. Persistent/on small left-sided pneumothorax estimated at 10-15%. IMPRESSION: Stable support apparatus. Stable small left-sided pneumothorax. Persistent asymmetric bilateral airspace process. Electronically Signed   By: Marijo Sanes M.D.   On: Aug 03, 2016 07:37   Dg Chest Port 1 View  Result Date: 2016/08/03 CLINICAL DATA:  Follow-up left-sided pneumothorax. Initial encounter. EXAM: PORTABLE CHEST 1 VIEW COMPARISON:  Chest radiograph performed 07/16/2016 FINDINGS: The patient's small left-sided pneumothorax appears slightly increased in size. Mildly worsening bilateral patchy airspace opacities raise concern for worsening bilateral pneumonia. Underlying vascular congestion is noted. Small bilateral pleural effusions are seen. The patient's endotracheal tube is seen ending 3 cm above the carina. An enteric tube is noted extending below the diaphragm. A right-sided subclavian line is noted ending about the distal SVC, while a left-sided chest port is noted ending about the cavoatrial junction. The cardiomediastinal silhouette is normal in size. No acute  osseous abnormalities are identified. IMPRESSION: 1. Small left-sided pneumothorax appears slightly increased in size. 2. Mildly worsening bilateral patchy airspace opacities raise concern for worsening bilateral pneumonia. 3. Underlying vascular congestion and small bilateral pleural effusions. Electronically Signed   By: Garald Balding M.D.   On: August 03, 2016 01:52   Dg Chest Port 1 View  Result Date: 07/16/2016 CLINICAL DATA:  LEFT pneumothorax, former smoker, hypertension, metastatic colon cancer EXAM:  PORTABLE CHEST 1 VIEW COMPARISON:  Portable exam 1946 hours compared to 1713 hours FINDINGS: Slightly rotated to the RIGHT. Tip of endotracheal tube projects 2.6 cm above carina. Nasogastric tube extends into stomach. RIGHT subclavian central venous catheter with tip projecting over SVC. LEFT subclavian Port-A-Cath with tip projecting over cavoatrial junction. Normal heart size mediastinal contours. BILATERAL pulmonary infiltrates increased on LEFT since previous exam. Persistent LEFT pneumothorax which extends from the apex to the base, slightly decreased at the basilar portion since the previous study. IMPRESSION: LEFT pneumothorax appears slightly decreased since the previous exam. BILATERAL pulmonary infiltrates increased on LEFT since previous study. Electronically Signed   By: Lavonia Dana M.D.   On: 07/16/2016 20:35   Dg Chest Port 1 View  Result Date: 07/16/2016 CLINICAL DATA:  Status post thoracentesis EXAM: PORTABLE CHEST 1 VIEW COMPARISON:  07/16/2016 at 1658 hours FINDINGS: Small left apical pneumothorax. Prior left pleural effusion is no longer visualized status post thoracentesis. Stable small right pleural effusion. Multifocal patchy opacities in the right upper lobe, left perihilar region, and bilateral lung bases. Endotracheal tube terminates 5 cm above the carina. Right subclavian venous catheter terminates in the lower SVC. Left chest port terminates at the cavoatrial junction. Enteric tube courses into the mid stomach. IMPRESSION: Small left apical pneumothorax status post thoracentesis. Additional stable ancillary findings as above. These results will be called to the ordering clinician or representative by the Radiologist Assistant, and communication documented in the PACS or zVision Dashboard. Electronically Signed   By: Julian Hy M.D.   On: 07/16/2016 17:36   Dg Chest Port 1 View  Result Date: 07/16/2016 CLINICAL DATA:  Hypoxia.  Status post thoracentesis EXAM: PORTABLE CHEST 1  VIEW COMPARISON:  Study obtained earlier in the day FINDINGS: Pleural effusion on the right is significantly smaller. There is a minimal pleural effusion on the right. There is moderate pleural effusion on the left. There is no evident pneumothorax. Endotracheal tube tip is 2.8 cm above the carina. Right central catheter as well as Port-A-Cath tips are both in the superior vena cava. Nasogastric tube tip and side port are in the stomach. In addition to effusion on the left, there is patchy atelectasis and consolidation in the left base. There is stable airspace consolidation in the right upper lobe and left perihilar regions. The heart is upper normal in size with pulmonary vascularity within normal limits. No adenopathy evident. IMPRESSION: Right pleural effusion has nearly completely resolved. No pneumothorax. Moderate pleural effusion on the left with left base consolidation/atelectasis remains. There is stable right upper lobe and left perihilar airspace opacity. Tube and catheter positions are as described. Electronically Signed   By: Lowella Grip III M.D.   On: 07/16/2016 16:09   Dg Chest Port 1 View  Result Date: 07/16/2016 CLINICAL DATA:  Respiratory failure . EXAM: PORTABLE CHEST 1 VIEW COMPARISON:  07/14/2016 . FINDINGS: Endotracheal tube, NG tube, right subclavian central line, left power port catheter in stable position. Heart size normal. Unchanged diffuse bilateral pulmonary infiltrates and or edema. Unchanged prominent bilateral pleural effusions.  No pneumothorax. IMPRESSION: 1. Lines and tubes in stable position. 2. Unchanged diffuse bilateral pulmonary infiltrates and or edema. Unchanged prominent bilateral pleural effusions. 3. Heart size normal. 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 Severe protein calorie  malnutrition Transudate b/l pleural effusions Iatrogenic Lt pneumothorax after thoracentesis 9/01 Septic shock Hx of HTN Anemia of chronic disease and critical illness Thrombocytopenia in setting of sepsis Acute Lt leg DVT Acute kidney injury Hypokalemia Non anion gap metabolic acidosis Hx of GERD Acute metabolic encephalopathy  Plan: - Extubate 9/02 >> will have d/w pt and family about goals of care - Consult palliative care team for symptom management and hospice assessment >> family would like to consider home hospice if possibe - continue Abx - wean off pressors to keep SBP > 90 - continue HCO3 in IV fluid  CC time 38 minutes  Chesley Mires, MD [place] 07/26/16, 10:00 AM Pager:  [phone] After 3pm call: [phone]

## 2016-08-15 NOTE — Progress Notes (Signed)
CRITICAL VALUE ALERT  Critical value received:  vanc trough 21, calcium 5.8  Date of notification:  2016-08-11  Time of notification:  [id number]  Critical value read back:Yes.    Nurse who received alert:  Zannie Cove  MD notified (1st page): [place]

## 2016-08-15 NOTE — Final Progress Note (Signed)
Present at bedside when patient expired.  No signs of distress with gradual decrease, then cessation, of respiratory activity.  Exam: Asystole on monitor Patient lying in bed. Does not respond to verbal or tactile stimulation.   Pupils fixed and nonreactive.  No corneal reflex present. No central or peripheral pulses. No auscultatable heart sounds or respiratory activity for greater than one minute.  Time of death 68.  Chaplain present.  Primary service notified by floor staff.       Micheline Rough, MD [place] Team [phone]

## 2016-08-15 NOTE — Progress Notes (Signed)
Pt passed away at 1521.  Asystole strip printed and this RN and Micheline Rough MD listened for 1 minute with no heart sounds.  Pt was DNR.  Family at bedside during passing.  [facility] notified and referral number [phone].  Irven [place], RN

## 2016-08-15 NOTE — Consult Note (Signed)
Consultation Note Date: 07/19/2016   Patient Name: Andre Fernandez  DOB: 24-Mar-1956  MRN: [id number]  Age / Sex: 60 y.o., male  PCP: Pcp Not In System Referring Physician: No att. providers found  Reason for Consultation: Establishing goals of care  HPI/Patient Profile: 60 y.o. male  with past medical history of metastatic cancer.   Clinical Assessment and Goals of Care: I met with Andre Fernandez this morning following extubation. He reports being tired and needing time to rest. He declined to speak further with me at that time. He did give me permission to talk with his family. I met with family, including patient's daughter, twin brother, and ex wife.  We discussed plan to have meeting with Andre Fernandez to discuss goals moving forward at 1630.  At that time, family reported that they felt he was tired and we began discussion on pathway to getting home with the support of hospice.  We reviewed what this would entail as well as the fact that he is critically ill in his care needs will continue to increase.  I then received call from nurse that he was acutely decompensating and proceeded directly back to his hospital room.  On arrival, his daughter was sitting with him in the bed and he had an acute change and was actively dying.  I discussed again with family regarding care plan moving forward and they were all in agreement that his wishes at this point in time would be a focus on comfort and dignity. We changed his CODE STATUS to DO NOT RESUSCITATE at that time.  I was present on the unit from that point until time of his death.  See separate final progress note.  SUMMARY OF RECOMMENDATIONS   - FULL COMFORT CARE/PATIENT EXPIRED  Code Status/Advance Care Planning:  DNR   Symptom Management:   Shortness of breath/pain: Opioids as needed  Anxiety: Ativan as needed  Excess secretions: Robinul as  needed   Additional Recommendations (Limitations, Scope, Preferences):  Full Comfort Care  Psycho-social/Spiritual:   Desire for further Chaplaincy support:yes  Additional Recommendations: Grief/Bereavement Support  Prognosis:   Hours - Days- Patient expired.  Discharge Planning: Hospital Death      Primary Diagnoses: Present on Admission: . Bacteremia . Colon cancer metastasized to multiple sites [facility]) . Ascites . DVT (deep venous thrombosis) ([place]) . Leukocytosis (leucocytosis) . Severe malnutrition ([place]) . Decubitus ulcer . Liver metastasis ([place]) . Anemia   I have reviewed the medical record, interviewed the patient and family, and examined the patient. The following aspects are pertinent.  Past Medical History:  Diagnosis Date  . Cancer ([place])    colon, liver mets per record from [place]  . GERD (gastroesophageal reflux disease)    use to have reflux, reports that lately he hhas been regurgitating "foamy" substance   . Heart murmur    told that he has a "light murmur"   . Hypertension   . Kidney stones 1980's   both times treated with cystoscopy   Social History  Social History  . Marital status: Divorced    Spouse name: N/A  . Number of children: 2  . Years of education: N/A   Occupational History  . 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 Smoking status: Former Smoker    Types: Cigarettes    Quit date: 11/16/2011  . Smokeless tobacco: Never Used  . Alcohol use No     Comment: Quit 3 years ago  . Drug use: No  . Sexual activity: Not Asked   Other Topics Concern  . 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 No pertinent family history. Scheduled Meds: Continuous Infusions: PRN Meds:. Medications Prior to Admission:  Prior to  Admission medications   Medication Sig Start Date End Date Taking? Authorizing Provider  bimatoprost (LUMIGAN) 0.03 % ophthalmic solution Place 1 drop into both eyes at bedtime.   Yes Historical Provider, MD  bisoprolol-hydrochlorothiazide (ZIAC) 10-6.25 MG tablet Take 1 tablet by mouth every evening.  05/27/16  Yes Historical Provider, MD  lidocaine-prilocaine (EMLA) cream Apply to port site one hour prior to use. Do not rub in, cover with plastic. Patient taking differently: Apply 1 application topically daily as needed (port access). Apply to port site one hour prior to use. Do not rub in, cover with plastic. 07/02/16  Yes Ladell Pier, MD  methocarbamol (ROBAXIN) 500 MG tablet Take 1 tablet (500 mg total) by mouth every 8 (eight) hours as needed for muscle spasms. 06/26/16  Yes Darci Current Simaan, PA-C  oxyCODONE-acetaminophen (PERCOCET/ROXICET) 5-325 MG tablet Take 1-2 tablets by mouth every 4 (four) hours as needed for severe pain. 06/26/16  Yes Darci Current Simaan, PA-C  Polyethylene Glycol 3350 (MIRALAX PO) Take 17 g by mouth daily. Mix in 8 ounces of liquid   Yes Ladell Pier, MD  prochlorperazine (COMPAZINE) 10 MG tablet Take 1 tablet (10 mg total) by mouth every 6 (six) hours as needed for nausea or vomiting. 07/02/16  Yes Ladell Pier, MD  Rivaroxaban 15 & 20 MG TBPK Take as directed on package: Start with one 9m tablet by mouth twice a day with food. On Day 22, switch to one 276mtablet once a day with food. 06/30/16  Yes LiOwens SharkNP  senna-docusate (SENNA S) 8.6-50 MG tablet Take 1-2 tablets by mouth 2 (two) times daily as needed for moderate constipation (take 1 tablet twice daily every day and if needed). take 1 tablet twice daily every day and if needed Can increase to #2 twice daily  Or can decrease to daily if needed   Yes GaLadell PierMD  fluconazole (DIFLUCAN) 100 MG tablet Take 1 tablet (100 mg total) by mouth daily. Patient not taking: Reported on 07/07/2016 07/02/16    LiOwens SharkNP   No Known Allergies Review of Systems  During encounter in a.m. patient denied any acute symptoms.  10 point review of systems was negative other than being "tired." On return encounter in the afternoon he is unable to provide any history or information  Physical Exam  General: Eyes open, lying in bed, does not respond to verbal or tactile stimulation.  He is actively dying  HEENT: No bruits, no goiter, no JVD Heart: Regular. No murmur  appreciated. Lungs: Significantly diminished air movement, respirations uneven Abdomen: Soft, nontender, nondistended, positive bowel sounds.  Ext: No significant edema Skin: Warm and dry Neuro: Unresponsive  Vital Signs: BP (!) 85/58   Pulse (!) 103   Temp 97.4 F (36.3 C) (Oral)   Resp 17   Ht 5' 8"  (1.727 m)   Wt 71.2 kg (156 lb 15.5 oz)   SpO2 90%   BMI 23.87 kg/m  Pain Assessment: CPOT POSS *See Group Information*: 1-Acceptable,Awake and alert Pain Score: Asleep   SpO2: SpO2: 90 % O2 Device:SpO2: 90 % O2 Flow Rate: .O2 Flow Rate (L/min): 14 L/min  IO: Intake/output summary: No intake or output data in the 24 hours ending 07/19/16 1614  LBM: Last BM Date: 07/16/16 Baseline Weight: Weight: 65.8 kg (145 lb) Most recent weight: Weight: 71.2 kg (156 lb 15.5 oz)     Palliative Assessment/Data:     Time In: 1400 Time Out: 1530 Time Total: 90 Greater than 50%  of this time was spent counseling and coordinating care related to the above assessment and plan.  Signed by: Micheline Rough, MD   Please contact Palliative Medicine Team phone at [phone] for questions and concerns.  For individual provider: See Shea Evans

## 2016-08-15 NOTE — Progress Notes (Signed)
Speech Language Pathology Discharge Patient Details Name: Andre Fernandez MRN: [id number] DOB: [birth year] Today's Date: Jul 22, 2016 Time:  -     Patient discharged from SLP services secondary to medical decline - will need to re-order SLP to resume therapy services.  Please see latest therapy progress note for current level of functioning and progress toward goals.    Progress and discharge plan discussed with patient and/or caregiver: Patient unable to participate in discharge planning and no caregivers available  GO     [name] 2016-07-22, 2:16 PM    [name] Colvin Caroli.Ed [employer] [phone]

## 2016-08-15 NOTE — Progress Notes (Signed)
Pharmacy Antibiotic Note  Andre Fernandez is a 60 y.o. male admitted on 07/08/2016 with bacteremia.  He was called to return to ED for positive blood cultures obtained at ED on 8/23.  PMH includes metastatic colon cancer (cycle 1 FOLFOX chemo on 07/06/16), DVT on Xarelto, sacral ulcer, and US paracentesis on 07/06/16. Patient's currently on vancomycin for bacteremia (plan to treat for 2 weeks total).  Cefepime restarted, but now changed to Zosyn for suspected aspiration.  Today, 08/04/16:  Afebrile, WBC decreased to 3  SCr continues to increase, CrCl ~ 19 ml/min  Vancomycin levels remain elevated  Plan:  Reduce to Zosyn 2.25 g IV q6h  Continue to hold vancomycin  Recheck vancomycin random level on 9/4 AM labs  Follow up renal fxn, culture results, and clinical course.    Height: 5\' 8"  (172.7 cm) Weight: 156 lb 15.5 oz (71.2 kg) IBW/kg (Calculated) : 68.4  Temp (24hrs), Avg:97.6 F (36.4 C), Min:96.5 F (35.8 C), Max:98.7 F (37.1 C)   Recent Labs Lab 07/11/16 0500  07/12/16 1030 07/13/16 0827  07/14/16 0340 07/14/16 0341 07/14/16 0356 07/14/16 1232 07/14/16 1610 07/15/16 0519 07/15/16 0615 07/16/16 0409 08/04/2016 0500  WBC 26.1*  --  23.1*  --   --  6.0  --   --   --   --   --   --  8.3 3.0*  CREATININE 0.85  --  1.30* 1.51*  --  1.88*  --   --   --   --   --  2.73* 3.30* 3.96*  LATICACIDVEN  --   --   --   --   --   --   --  3.5* 5.1* 5.2*  --   --   --   --   VANCOTROUGH  --   < >  --   --   < >  --   --   --   --   --  24*  --   --  21*  VANCORANDOM  --   --   --   --   --   --  19  --   --   --   --   --  23  --   < > = values in this interval not displayed.  Estimated Creatinine Clearance: 19.2 mL/min (by C-G formula based on SCr of 3.96 mg/dL).    No Known Allergies  Antimicrobials this admission: 8/25 Cefepime >> 8/27, resumed 8/30 Cefepime >> 8/30 8/25 Vancomycin >> 8/27 Augmentin >>8/29 8/30 Zosyn >>  Dose adjustments this admission: 8/27: Vanc  Trough at 2100 = 22 on 750mg  q12 : change to 500mg  IV q12h 8/29: VT @ 15:00 = 28 mcg/ml = Vanc on hold 8/30 Vrandom @ 0500: 19 mg/L, Give Vanc 500mg  x1  8/31 Vrandom @ 0600 (24 hr level)= 24 mcg/ml 9/1 Vrandom @ 0500 = 23 9/2 Vrandom @ 0500 = 21  Microbiology results: 8/23 (From previous ED visit) BCx: 1/2 gram variable rod, bacillus species (aerobic bottle only), 1/2 NGF 8/23 BCID: NONE DETECTED 8/25 BCx x2: NGF 8/25 UCx: NGF 8/28 BCx: ngtd 8/30 respiratory cxt: normal flora  8/30 MRSA PCR: negative  Thank you for allowing pharmacy to be a part of this patient's care.  Gretta Arab PharmD, BCPS Pager [phone] 04-Aug-2016 12:02 PM

## 2016-08-15 NOTE — Progress Notes (Signed)
[facility] called to bedside when pt was placed on comfort care. Pt's wife, daughter (who was HPOA) was bedside w/twin brother and a host of other family members were bedside. Family members acknowledged that prayer is comforting; had prayer as family members gathered bedside of pt. Daughter's grief was intense, but appropriate and her family surrounded her w/support. [place] remained w/family until they were comforted in accepting of their loss. Family stated their appreciation of [place] and staff support. Family encouraged to visit and say goodbyes as they need.  Chaplain Ernest Haber, M.Div.   08/09/2016 1500  Clinical Encounter Type  Visited With Family

## 2016-08-15 NOTE — Progress Notes (Signed)
eLink Physician-Brief Progress Note Patient Name: Andre Fernandez DOB: 11/05/1956 MRN: [id number]   Date of Service  2016-07-20  HPI/Events of Note  Hgb drop from 7.2 to 6.9  eICU Interventions  Plan: Transfuse 1 unit pRBC Post-transfusion CBC     Intervention Category Intermediate Interventions: Other:  Mollie Rossano July 20, 2016, 5:43 AM

## 2016-08-15 DEATH — deceased

## 2016-10-30 ENCOUNTER — Other Ambulatory Visit: Payer: Self-pay | Admitting: Nurse Practitioner

## 2018-07-19 IMAGING — CT CT CHEST W/ CM
2 of 3 series · 10 of 32 positions shown, 14 images · IV contrast (APPLIED)
Comparison: No priors.

CLINICAL DATA: 59-year-old male with history of colorectal cancer
of the sigmoid colon recently diagnosed 4 days ago. Weight loss.
Difficult loose stools. Pelvic pain.

EXAM:
CT CHEST, ABDOMEN, AND PELVIS WITH CONTRAST
TECHNIQUE: Multidetector CT imaging of the chest, abdomen and pelvis was
performed following the standard protocol during bolus
administration of intravenous contrast.
CONTRAST:  100mL [id number] IOPAMIDOL ([id number]) INJECTION 61%

[Series 2: chest/abd/pelvis w/cm · axial · 0.70mm/px · z∈[+473,+932]mm · 7 of 218 slices shown]
[im 26/218  soft-tissue]
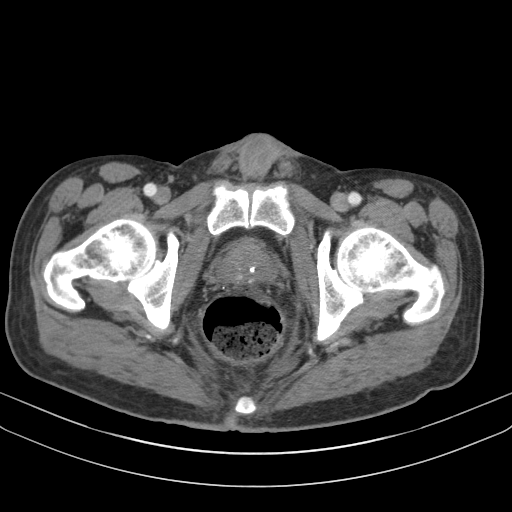
[im 52/218  soft-tissue]
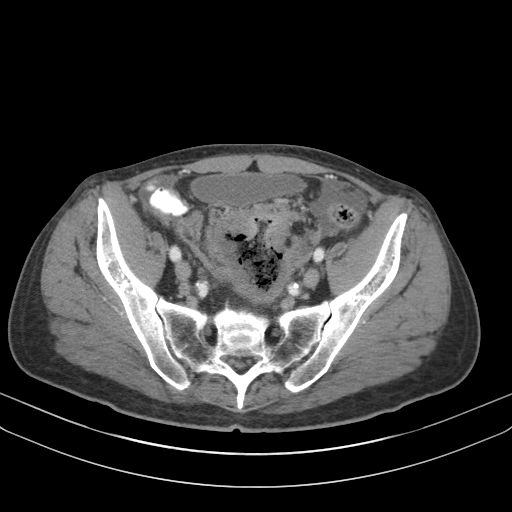
[im 77/218  soft-tissue]
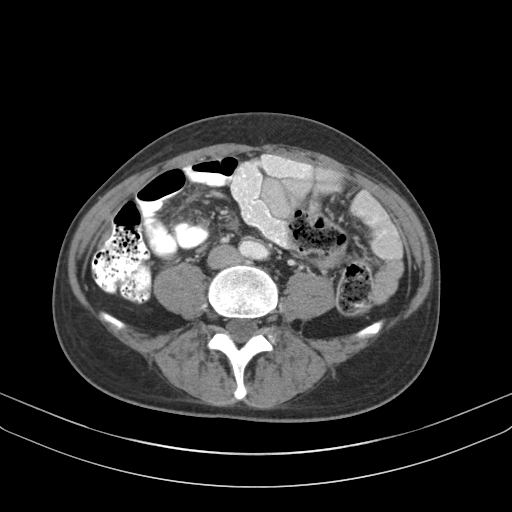
[im 103/218  soft-tissue]
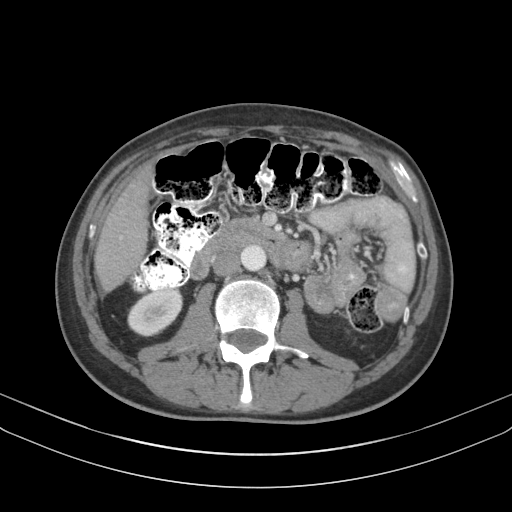
[im 128/218  soft-tissue]
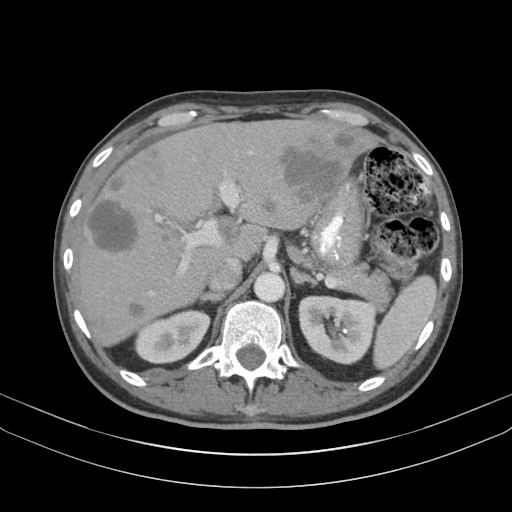
[im 154/218  soft-tissue]
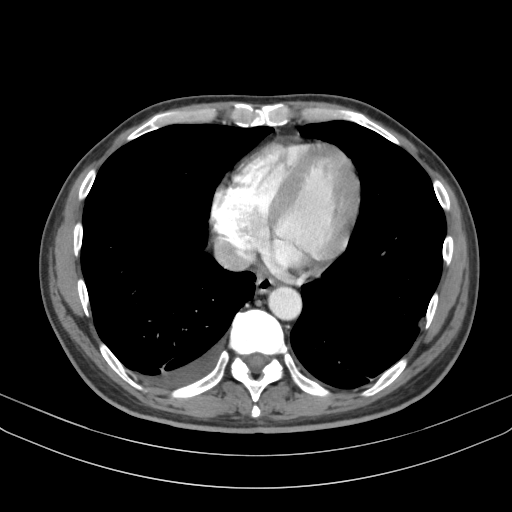
[im 179/218  soft-tissue]
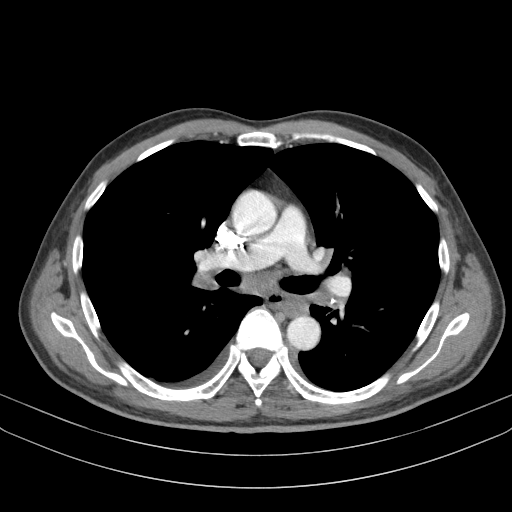

[Series 7: renal delay · axial · delayed · 0.70mm/px · z∈[+668,+843]mm · 3 of 36 slices shown, 7 images]
[im 1/36  soft-tissue]
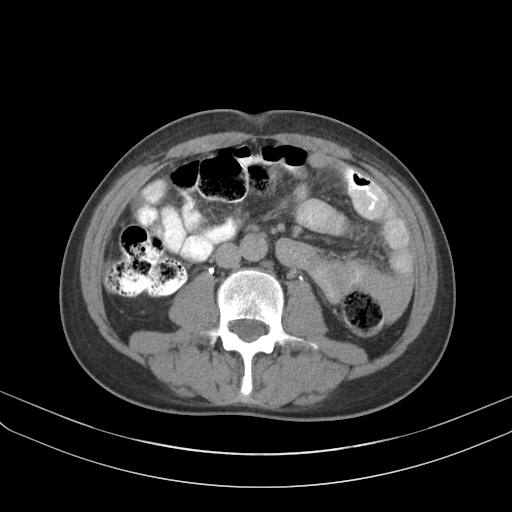
[im 1/36  lung]
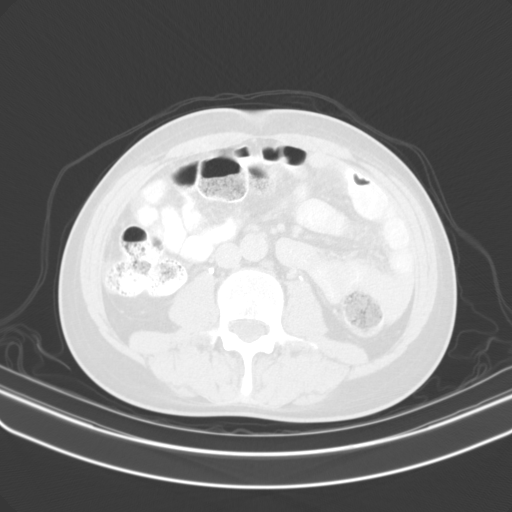
[im 1/36  bone]
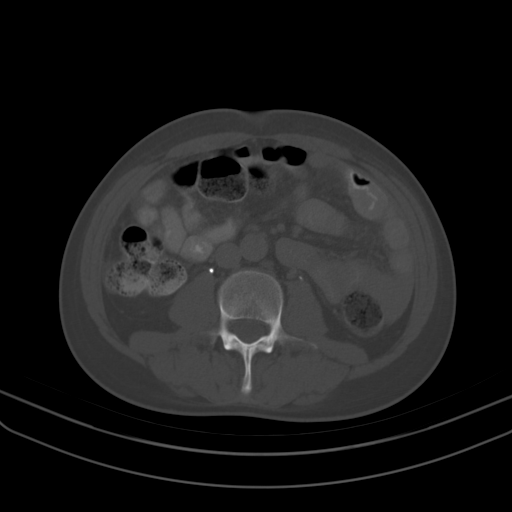
[im 18/36  soft-tissue]
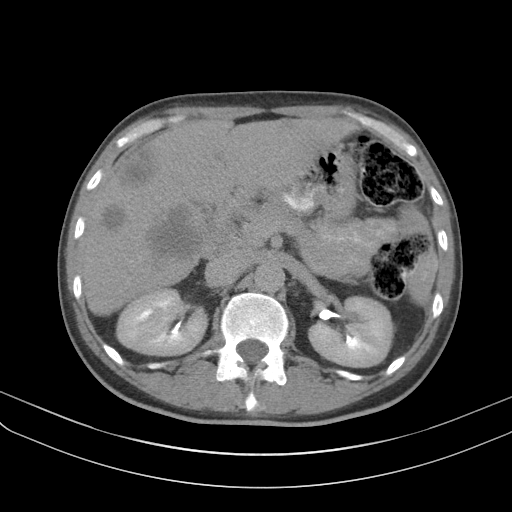
[im 18/36  lung]
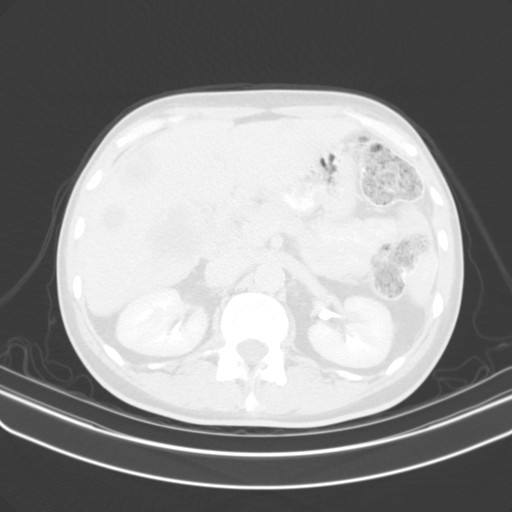
[im 36/36  soft-tissue]
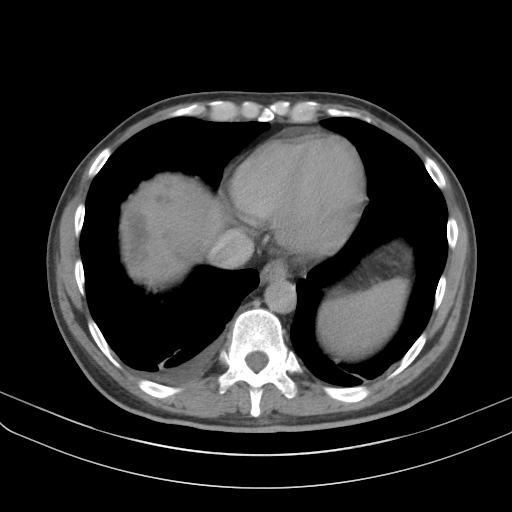
[im 36/36  lung]
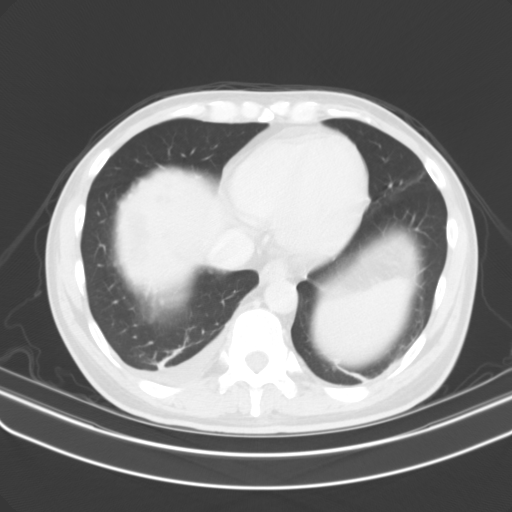

[10 of 32 positions shown; findings below may reference images not displayed]

FINDINGS: CT CHEST FINDINGS

Cardiovascular: Heart size is normal. Small amount of anterior
pericardial fluid and/or thickening, unlikely to be of any
hemodynamic significance at this time. No associated pericardial
calcification. There is aortic atherosclerosis, as well as
atherosclerosis of the great vessels of the mediastinum and the
coronary arteries, including calcified atherosclerotic plaque in the
left anterior descending coronary arteries.

Mediastinum/Nodes: There are multiple borderline enlarged and
enlarged mediastinal and bilateral hilar lymph nodes measuring up to
16 mm in short axis in the subcarinal nodal station. Superior
mediastinal lymph node (right paratracheal) measuring 14 mm in short
axis also noted. Notably, many of these lymph nodes demonstrate
coarse calcifications, particularly in the left hilar region.
Esophagus is unremarkable in appearance. No axillary
lymphadenopathy.

Lungs/Pleura: Multiple pulmonary nodules scattered throughout the
lungs bilaterally, the largest of which measures 14 x 8 mm (mean
diameter of 11 mm) in the subpleural aspect of the right lower lobe
(image 71 of series 6). These nodules appear randomly distributed
throughout the lungs with a slight basal predominance, highly
suspicious for metastatic disease. Trace bilateral pleural effusions
(right greater than left) lying dependently. No acute consolidative
airspace disease. Some dependent areas of subsegmental atelectasis
and/or scarring are noted throughout the lung bases bilaterally.

Musculoskeletal: There are no aggressive appearing lytic or blastic
lesions noted in the visualized portions of the skeleton.

CT ABDOMEN AND PELVIS FINDINGS

Hepatobiliary: There are numerous hypovascular nodules and masses
throughout the liver, compatible with widespread metastatic disease.
The largest of these lesions is in the right lobe of the liver
occupying segments 7 and 8 (image 79 of series 2, and coronal image
61 of series 3) measuring 7.7 x 6.4 x 5.6 cm. Another large lesion
occupies the central aspect of segments 5 and 6 of the liver (image
99 of series 2 and coronal image 49 of series 3) measuring 5.0 x
x 5.2 cm, and appears to extends medial to the liver where it is
intimately associated with the duodenal bulb and proximal second
portion of the duodenum, which it appears to partially encase, best
appreciated on coronal images. No intra or extrahepatic biliary
ductal dilatation. Gallbladder is nearly completely decompressed.

Pancreas: No pancreatic mass. No pancreatic ductal dilatation. No
pancreatic or peripancreatic fluid or inflammatory changes.

Spleen: Several sub cm low-attenuation lesions in the spleen are too
small to characterize and highly nonspecific.

Adrenals/Urinary Tract: Several nonobstructive calculi are noted
within the collecting systems of the kidneys bilaterally measuring
up to 3 mm in the upper pole of the left kidney. No ureteral or
bladder calculi are identified. No hydroureteronephrosis. No
suspicious renal lesions. Urinary bladder is normal in appearance.
Bilateral adrenal glands are normal in appearance.

Stomach/Bowel: Normal appearance of the stomach. First and second
portions of the duodenum appear encased by an exophytic mass
extending off the medial margin of segments 5 and 6 of the liver
(discussed above), however, the duodenum does not appear obstructed
by this mass at this time, as there is no proximal gastric
distension. No pathologic dilatation of small bowel or colon.
Infiltrative irregular-shaped colonic mass in the mid sigmoid colon,
estimated to measure approximately 6.8 x 8.3 x 7.5 cm (axial image
171 of series 2 and coronal image 53 of series 3). This lesion
demonstrates heterogeneous severe mural thickening, and there is
extensive surrounding metastatic disease (both direct invasion,
metastatic peritoneal deposits and lymphadenopathy) in the
associated mesocolon, as evidenced by additional areas of soft
tissue thickening and nodularity, and diffuse soft tissue stranding.
The largest single peritoneal deposit is noted on image 172 of
series 2 on the left side of the anatomic pelvis measuring 2.2 x
cm where there is a enhancing soft tissue nodule. Small volume of
malignant appearing loculated ascites in the low anatomic pelvis.
Numerous borderline enlarged and mildly enlarged enhancing meso
rectal lymph nodes are also noted measuring up to 13 mm in short
axis on the left side (image 175 of series 2).

Vascular/Lymphatic: Aortic atherosclerosis, without evidence of
aneurysm or dissection in the abdominal or pelvic vasculature.
Mesorectal lymphadenopathy, as well as lymphadenopathy and direct
malignant infiltration in the sigmoid mesocolon, as discussed above.
Borderline enlarged 1 cm short axis hepatoduodenal ligament lymph
node. Mildly enlarged celiac axis lymph node measuring 1.1 cm in
short axis.

Reproductive: Prostate gland and seminal vesicles are unremarkable
in appearance.

Other: Metastatic intraperitoneal deposits throughout the low
anatomic pelvis, as detailed above. Trace volume of malignant
ascites in the low anatomic pelvis. No pneumoperitoneum.

Musculoskeletal: There are no aggressive appearing lytic or blastic
lesions noted in the visualized portions of the skeleton.
IMPRESSION: 1. 6.8 x 8.3 x 7.5 cm infiltrative colonic mass centered in the mid
sigmoid colon with direct extension into the adjacent soft tissues,
widespread intraperitoneal metastatic disease in the low anatomic
pelvis, small volume of malignant ascites, pelvic, mesorectal and
upper abdominal lymphadenopathy, widespread metastatic disease to
the liver, and numerous pulmonary metastases, as above. Notably, one
of the liver lesions centered in segments 5 and 6 appears to be
invading of medial to the liver capsule, partially encasing the
first and second portions of the duodenum. This does not appear to
be causing obstruction at this time.
2. Trace bilateral pleural effusions may be malignant.
3. Extensive mediastinal and bilateral hilar lymphadenopathy is also
noted. Although this could be metastatic, many these lymph nodes are
coarsely calcified, suggesting benign disease such as old
granulomatous disease or potential sarcoidosis.
4. Nonobstructive calculi within the collecting systems of the
kidneys bilaterally measuring up to 3 mm in the upper pole of the
left kidney.
5. Additional incidental findings, as above.
These results will be called to the ordering clinician or
representative by the Radiologist Assistant, and communication
documented in the PACS or zVision Dashboard.
